# Patient Record
Sex: Male | Born: 1945 | Race: White | Marital: Single | Smoking: Current every day smoker
Health system: Southern US, Community
[De-identification: ages and names within clinical notes are randomized; demographics above are authoritative.]

## PROBLEM LIST (undated history)

## (undated) DIAGNOSIS — R296 Repeated falls: Secondary | ICD-10-CM

## (undated) DIAGNOSIS — N419 Inflammatory disease of prostate, unspecified: Secondary | ICD-10-CM

## (undated) HISTORY — DX: Inflammatory disease of prostate, unspecified: N41.9

## (undated) HISTORY — PX: TONSILLECTOMY: SUR1361

## (undated) HISTORY — DX: Repeated falls: R29.6

## (undated) HISTORY — PX: CYST REMOVAL NECK: SHX6281

## (undated) HISTORY — PX: VASECTOMY: SHX75

---

## 2015-03-24 ENCOUNTER — Ambulatory Visit (INDEPENDENT_AMBULATORY_CARE_PROVIDER_SITE_OTHER): Payer: Medicaid Other | Admitting: Neurology

## 2015-03-24 ENCOUNTER — Encounter: Payer: Self-pay | Admitting: Neurology

## 2015-03-24 VITALS — BP 184/86 | HR 79 | Ht 68.0 in | Wt 178.0 lb

## 2015-03-24 DIAGNOSIS — R569 Unspecified convulsions: Secondary | ICD-10-CM

## 2015-03-24 DIAGNOSIS — R4020 Unspecified coma: Secondary | ICD-10-CM | POA: Insufficient documentation

## 2015-03-24 DIAGNOSIS — R296 Repeated falls: Secondary | ICD-10-CM | POA: Insufficient documentation

## 2015-03-24 DIAGNOSIS — W19XXXD Unspecified fall, subsequent encounter: Principal | ICD-10-CM

## 2015-03-24 DIAGNOSIS — IMO0001 Reserved for inherently not codable concepts without codable children: Secondary | ICD-10-CM

## 2015-03-24 NOTE — Progress Notes (Signed)
PATIENT: Blake Stevens DOB: 05-25-1945  Chief Complaint  Patient presents with  . Frequent Falling    He is here with his sister, Venita Sheffield, today.  He estimates having at least 12 or more falls over the last 6 months.  Says he has no warning signs, such as dizziness, but feels his legs just give out for no reason.  He has brought in a recent MRI brain scan today for review.     HISTORICAL  Blake Stevens is a 70 years old right-handed male, accompanied by his sister Venita Sheffield, seen in refer by his primary care physician Dr. Lynelle Smoke for evaluation of frequent falling  Since 2016, he had recurrent episode of falling without any warning signs, gradually getting worse in 2016, in early January 2017, he had fell twice, he denied loss of consciousness, sudden drop to the floor, no bowel and bladder incontinence,   He denied persistent bilateral upper and lower chunky paresthesia or weakness, he denies gait difficulty in between, since summer of 2016, he began to have urinary urgency, occasionally bowel and bladder incontinence.   He carries a diagnosis of seizure in the past, he had one incident that while visiting his family at the hospital around 2008, he suddenly dropped to the floor, he had no recollection of loss of consciousness, but the event was witnessed by health caregiver, he was diagnosed with seizure, was prescribed medications, but he never took the medicine, there was no recurrent episode until 2016, he reported that current episode is very similar to previous episodes.   I have personally reviewed MRI of the brain with and without contrast in November 2016 from [facility], no acute lesions, generalized atrophy, mild supratentorium small vessel disease,   REVIEW OF SYSTEMS: Full 14 system review of systems performed and notable only for snoring, slurred speech, snoring, running nose  ALLERGIES: Allergies  Allergen Reactions  . Aspirin Shortness Of Breath    HOME  MEDICATIONS: Current Outpatient Prescriptions  Medication Sig Dispense Refill  . Acetaminophen (TYLENOL PO) as needed.    . calcium carbonate (TUMS) 500 MG chewable tablet as needed for indigestion or heartburn.    . Chlorphen-Phenyleph-ASA (ALKA-SELTZER PLUS COLD PO) as needed.    . Multiple Vitamins-Minerals (MULTIVITAMIN ADULT PO) daily.    . tamsulosin (FLOMAX) 0.4 MG CAPS capsule Take 0.4 mg by mouth daily.  3   No current facility-administered medications for this visit.    PAST MEDICAL HISTORY: Past Medical History  Diagnosis Date  . Prostatitis   . Frequent falls     PAST SURGICAL HISTORY: Past Surgical History  Procedure Laterality Date  . Cyst removal neck    . Vasectomy    . Tonsillectomy      FAMILY HISTORY: Family History  Problem Relation Age of Onset  . Colon cancer Father   . COPD Father   . Hypertension Mother   . Hyperlipidemia Mother   . Heart disease Mother     SOCIAL HISTORY:  Social History   Social History  . Marital Status: Single    Spouse Name: N/A  . Number of Children: 2  . Years of Education: HS   Occupational History  . Retired    Social History Main Topics  . Smoking status: Current Every Day Smoker -- 0.50 packs/day    Types: Cigarettes  . Smokeless tobacco: Not on file  . Alcohol Use: No     Comment: No alcohol use in the last 9 months.  Marland Kitchen  Drug Use: No  . Sexual Activity: Not on file   Other Topics Concern  . Not on file   Social History Narrative   Lives with his sister.   Right-handed.   32 ounces of caffenated tea per day.     PHYSICAL EXAM   Filed Vitals:   03/24/15 1038  BP: 184/86  Pulse: 79  Height:  (1.727 m)  Weight: 178 lb (80.74 kg)    Not recorded      Body mass index is 27.07 kg/(m^2).  PHYSICAL EXAMNIATION:  Gen: NAD, conversant, well nourised, obese, well groomed                     Cardiovascular: Regular rate rhythm, no peripheral edema, warm, nontender. Eyes: Conjunctivae  clear without exudates or hemorrhage Neck: Supple, no carotid bruise. Pulmonary: Clear to auscultation bilaterally   NEUROLOGICAL EXAM:  MENTAL STATUS: Speech:    Speech is normal; fluent and spontaneous with normal comprehension.  Cognition:     Orientation to time, place and person     Normal recent and remote memory     Normal Attention span and concentration     Normal Language, naming, repeating,spontaneous speech     Fund of knowledge   CRANIAL NERVES: CN II: Visual fields are full to confrontation. Fundoscopic exam is normal with sharp discs and no vascular changes. Pupils are round equal and briskly reactive to light. CN III, IV, VI: extraocular movement are normal. No ptosis. CN V: Facial sensation is intact to pinprick in all 3 divisions bilaterally. Corneal responses are intact.  CN VII: Face is symmetric with normal eye closure and smile. CN VIII: Hearing is normal to rubbing fingers CN IX, X: Palate elevates symmetrically. Phonation is normal. CN XI: Head turning and shoulder shrug are intact CN XII: Tongue is midline with normal movements and no atrophy.  MOTOR: There is no pronator drift of out-stretched arms. Muscle bulk and tone are normal. Muscle strength is normal.  REFLEXES: Reflexes are 2+ and symmetric at the biceps, triceps, knees, and ankles. Plantar responses are flexor.  SENSORY: Intact to light touch, pinprick, position sense, and vibration sense are intact in fingers and toes.  COORDINATION: Rapid alternating movements and fine finger movements are intact. There is no dysmetria on finger-to-nose and heel-knee-shin.    GAIT/STANCE: Posture is normal, Mildly wide-based, steady, he is able to walk on tiptoe, heels, he has mild tandem walking difficulty.   Romberg signs: Negative  DIAGNOSTIC DATA (LABS, IMAGING, TESTING) - I reviewed patient records, labs, notes, testing and imaging myself where available.   ASSESSMENT AND PLAN  Blake Stevens is  a 70 y.o. male   Frequent falling  Unsure etiology, he does has brisk reflexes on examination, need to rule out cervical spondylitic myelopathy,  Potential differentiation diagnosis also includes seizure, especially with his reported history of seizure  Proceed with MRI of cervical  EEG  Document or event   Return to clinic in 1 month     Levert Feinstein, M.D. Ph.D.  [facility] [address] [place], [place] [address] Ph: [phone] Fax: [phone]  CC: Juliette Alcide, MD

## 2015-04-01 ENCOUNTER — Encounter: Payer: Self-pay | Admitting: Neurology

## 2015-04-16 ENCOUNTER — Other Ambulatory Visit: Payer: Medicaid Other

## 2015-04-22 ENCOUNTER — Ambulatory Visit (INDEPENDENT_AMBULATORY_CARE_PROVIDER_SITE_OTHER): Payer: Medicaid Other | Admitting: Neurology

## 2015-04-22 DIAGNOSIS — R569 Unspecified convulsions: Secondary | ICD-10-CM

## 2015-04-22 DIAGNOSIS — IMO0001 Reserved for inherently not codable concepts without codable children: Secondary | ICD-10-CM

## 2015-04-22 DIAGNOSIS — W19XXXD Unspecified fall, subsequent encounter: Principal | ICD-10-CM

## 2015-04-23 NOTE — Procedures (Signed)
   HISTORY: 70 year old male with frequent unexpected falls.  TECHNIQUE:  16 channel EEG was performed based on standard 10-16 international system. One channel was dedicated to EKG, which has demonstrates normal sinus rhythm of 84 beats per minutes.  Upon awakening, the posterior background activity was well-developed, in alpha range, 8 Hz, reactive to eye opening and closure.  There was no evidence of epileptiform discharge.  Photic stimulation was performed, which induced a symmetric photic driving.  Hyperventilation was performed, there was no abnormality elicit.  No sleep was achieved.  CONCLUSION: This is a  normal awake EEG.  There is no electrodiagnostic evidence of epileptiform discharge

## 2015-04-30 ENCOUNTER — Encounter: Payer: Self-pay | Admitting: Neurology

## 2015-04-30 ENCOUNTER — Ambulatory Visit (INDEPENDENT_AMBULATORY_CARE_PROVIDER_SITE_OTHER): Payer: Medicaid Other | Admitting: Neurology

## 2015-04-30 VITALS — BP 163/79 | HR 79 | Ht 68.0 in | Wt 178.0 lb

## 2015-04-30 DIAGNOSIS — W19XXXD Unspecified fall, subsequent encounter: Principal | ICD-10-CM

## 2015-04-30 DIAGNOSIS — IMO0001 Reserved for inherently not codable concepts without codable children: Secondary | ICD-10-CM

## 2015-04-30 DIAGNOSIS — R569 Unspecified convulsions: Secondary | ICD-10-CM

## 2015-04-30 NOTE — Progress Notes (Signed)
PATIENT: Blake Stevens DOB: 03-Dec-1945  Chief Complaint  Patient presents with  . Frequent Falls    He is here with his sister, Nelida Gores and his mother, Venita Sheffield. He would like to review his EEG.  There was some confusion when [facility] called him to schedule his MRI and it was never completed. I called today and he now has a pending appt on 05/01/15 .  He has continued to have frequent falls.  Denies any serious injuries.     HISTORICAL  Blake Stevens is a 69 years old right-handed male, accompanied by his sister Venita Sheffield, seen in refer by his primary care physician Dr. Lynelle Smoke for evaluation of frequent falling  Since 2016, he had recurrent episode of falling without any warning signs, gradually getting worse in 2016, in early January 2017, he had fell twice, he denied loss of consciousness, sudden drop to the floor, no bowel and bladder incontinence,   He denied persistent bilateral upper and lower extremity paresthesia or weakness, he denies gait difficulty in between, since summer of 2016, he began to have urinary urgency, occasionally bowel and bladder incontinence.   He carries a diagnosis of seizure in the past, he had one incident that while visiting his family at the hospital around 2008, he suddenly dropped to the floor, he had no recollection of loss of consciousness, but the event was witnessed by health caregiver, he was diagnosed with seizure, was prescribed medications, but he never took the medicine, there was no recurrent episode until 2016, he reported that current episode is very similar to previous episodes.   I have personally reviewed MRI of the brain with and without contrast in November 2016 from [facility], no acute lesions, generalized atrophy, mild supratentorium small vessel disease,   Update April 30 2015: EEG was normal in Feb 2017. He had 5 episodes of sudden drop to the floor, it usually happened after he got up from a seated position for a  while, no LOC, no warning signs. MRI cervical is pending.  REVIEW OF SYSTEMS: Full 14 system review of systems performed and notable only for snoring, slurred speech, snoring, running nose  ALLERGIES: Allergies  Allergen Reactions  . Aspirin Shortness Of Breath    HOME MEDICATIONS: Current Outpatient Prescriptions  Medication Sig Dispense Refill  . Acetaminophen (TYLENOL PO) as needed.    . calcium carbonate (TUMS) 500 MG chewable tablet as needed for indigestion or heartburn.    . Chlorphen-Phenyleph-ASA (ALKA-SELTZER PLUS COLD PO) as needed.    Marland Kitchen lisinopril (PRINIVIL,ZESTRIL) 10 MG tablet TAKE 1 TABLET EVERY DAY FOR BLOOD PRESSURE  1  . Multiple Vitamins-Minerals (MULTIVITAMIN ADULT PO) daily.    . tamsulosin (FLOMAX) 0.4 MG CAPS capsule Take 0.4 mg by mouth daily.  3   No current facility-administered medications for this visit.    PAST MEDICAL HISTORY: Past Medical History  Diagnosis Date  . Prostatitis   . Frequent falls     PAST SURGICAL HISTORY: Past Surgical History  Procedure Laterality Date  . Cyst removal neck    . Vasectomy    . Tonsillectomy      FAMILY HISTORY: Family History  Problem Relation Age of Onset  . Colon cancer Father   . COPD Father   . Hypertension Mother   . Hyperlipidemia Mother   . Heart disease Mother     SOCIAL HISTORY:  Social History   Social History  . Marital Status: Single    Spouse Name: N/A  .  Number of Children: 2  . Years of Education: HS   Occupational History  . Retired    Social History Main Topics  . Smoking status: Current Every Day Smoker -- 0.50 packs/day    Types: Cigarettes  . Smokeless tobacco: Not on file  . Alcohol Use: No     Comment: No alcohol use in the last 9 months.  . Drug Use: No  . Sexual Activity: Not on file   Other Topics Concern  . Not on file   Social History Narrative   Lives with his sister.   Right-handed.   32 ounces of caffenated tea per day.     PHYSICAL EXAM     Filed Vitals:   04/30/15 1148  BP: 163/79  Pulse: 79  Height: 5\' 8"  (1.727 m)  Weight: 178 lb (80.74 kg)   Blood pressure lying down 167/89, heart rate of 92, sitting, 174/83, heart rate of 92, standing 188/74 heart rate of 94  Body mass index is 27.07 kg/(m^2).  PHYSICAL EXAMNIATION:  Gen: NAD, conversant, well nourised, obese, well groomed                     Cardiovascular: Regular rate rhythm, no peripheral edema, warm, nontender. Eyes: Conjunctivae clear without exudates or hemorrhage Neck: Supple, no carotid bruise. Pulmonary: Clear to auscultation bilaterally   NEUROLOGICAL EXAM:  MENTAL STATUS: Speech:    Speech is normal; fluent and spontaneous with normal comprehension.  Cognition:     Orientation to time, place and person     Normal recent and remote memory     Normal Attention span and concentration     Normal Language, naming, repeating,spontaneous speech     Fund of knowledge   CRANIAL NERVES: CN II: Visual fields are full to confrontation. Fundoscopic exam is normal with sharp discs and no vascular changes. Pupils are round equal and briskly reactive to light. CN III, IV, VI: extraocular movement are normal. No ptosis. CN V: Facial sensation is intact to pinprick in all 3 divisions bilaterally. Corneal responses are intact.  CN VII: Face is symmetric with normal eye closure and smile. CN VIII: Hearing is normal to rubbing fingers CN IX, X: Palate elevates symmetrically. Phonation is normal. CN XI: Head turning and shoulder shrug are intact CN XII: Tongue is midline with normal movements and no atrophy.  MOTOR: There is no pronator drift of out-stretched arms. Muscle bulk and tone are normal. Muscle strength is normal.  REFLEXES: Reflexes are 2+ and symmetric at the biceps, triceps, knees, and ankles. Plantar responses are flexor.  SENSORY: Intact to light touch, pinprick, position sense, and vibration sense are intact in fingers and  toes.  COORDINATION: Rapid alternating movements and fine finger movements are intact. There is no dysmetria on finger-to-nose and heel-knee-shin.    GAIT/STANCE: Posture is normal, Mildly wide-based, steady, he is able to walk on tiptoe, heels, he has mild tandem walking difficulty.   Romberg signs: Negative  DIAGNOSTIC DATA (LABS, IMAGING, TESTING) - I reviewed patient records, labs, notes, testing and imaging myself where available.   ASSESSMENT AND PLAN  Sharyon CableHarold Nazaire is a 70 y.o. male   Frequent falling  Unsure etiology, possible orthostatic hypoperfusion, no evidence of orthostatic blood pressure changes.  ECHO, US carotid  Document all event.    Levert FeinsteinYijun Raenell Mensing, M.D. Ph.D.  [facility] [address], [address] [place], [place] [address] Ph: [phone]) [phone] Fax: [phone]  CC: Juliette AlcideSteven E Burdine, MD

## 2015-05-01 ENCOUNTER — Ambulatory Visit
Admission: RE | Admit: 2015-05-01 | Discharge: 2015-05-01 | Disposition: A | Discharge status: Home / Self Care | Payer: Medicaid Other | Source: Ambulatory Visit | Attending: Neurology | Admitting: Neurology

## 2015-05-01 DIAGNOSIS — W19XXXD Unspecified fall, subsequent encounter: Secondary | ICD-10-CM | POA: Diagnosis not present

## 2015-05-01 DIAGNOSIS — R569 Unspecified convulsions: Secondary | ICD-10-CM | POA: Diagnosis not present

## 2015-05-01 DIAGNOSIS — IMO0001 Reserved for inherently not codable concepts without codable children: Secondary | ICD-10-CM

## 2015-05-04 ENCOUNTER — Telehealth: Payer: Self-pay | Admitting: Neurology

## 2015-05-04 NOTE — Telephone Encounter (Signed)
Spoke to his sister (on [employer]) - she is aware of results and will keep pending appts.

## 2015-05-04 NOTE — Telephone Encounter (Signed)
Please call patient, MRI of cervical spine showed multilevel degenerative changes, but there was no significant canal stenosis, no evidence of spinal cord damage, the findings would not explain his complaints of sudden falling, continue evaluation as planned during last office visit   IMPRESSION: This MRI of the cervical spine shows the following: 1. At C3-C4 there is moderately severe right foraminal narrowing at could lead to compression of the right C4 nerve root. 2. At C4-C5, the interspace appears to be fused of a degenerative nature. There does not appear to be any nerve root compression. 3. At C5-C6, there is borderline spinal stenosis due to degenerative changes. There is also moderately severe right greater than left foraminal narrowing and there is potential for right C6 nerve root compression. 4. At C6-C7, there is borderline spinal stenosis due to degenerative changes. There is moderately severe left foraminal narrowing that could lead to compression of the left C7 nerve root. 5. The spinal cord appears normal.  6. There are multiple subcutaneous solid structures with heterogenous appearance as detailed above. The 3 suboccipital lesions were seen on MRI of the brain 12/11/2014 and showed enhancement. These could represent neurofibromas or other soft tissue. Consider biopsy or other evaluation if not already done so.

## 2015-05-12 NOTE — Addendum Note (Signed)
Addended by: Levert FeinsteinYAN, Elyn Krogh on: 05/12/2015 08:53 AM   Modules accepted: Orders

## 2015-05-18 ENCOUNTER — Telehealth: Payer: Self-pay | Admitting: Neurology

## 2015-05-18 NOTE — Telephone Encounter (Signed)
Pt's sister called sts he is hard of hearing and when cardiologist office called he didn't get all the information. Please call her with name and address. Thank you

## 2015-05-19 NOTE — Telephone Encounter (Signed)
Called and spoke to patient"s  sister relayed [place] [facility] [place] [facility] . Patient's sister understood details of appointment.

## 2015-05-27 ENCOUNTER — Ambulatory Visit (INDEPENDENT_AMBULATORY_CARE_PROVIDER_SITE_OTHER): Payer: Medicaid Other

## 2015-05-27 ENCOUNTER — Encounter (INDEPENDENT_AMBULATORY_CARE_PROVIDER_SITE_OTHER): Payer: Self-pay

## 2015-05-27 ENCOUNTER — Other Ambulatory Visit: Payer: Self-pay

## 2015-05-27 ENCOUNTER — Ambulatory Visit: Payer: Medicaid Other

## 2015-05-27 ENCOUNTER — Telehealth: Payer: Self-pay | Admitting: Neurology

## 2015-05-27 DIAGNOSIS — R569 Unspecified convulsions: Secondary | ICD-10-CM

## 2015-05-27 DIAGNOSIS — W19XXXD Unspecified fall, subsequent encounter: Secondary | ICD-10-CM

## 2015-05-27 DIAGNOSIS — IMO0001 Reserved for inherently not codable concepts without codable children: Secondary | ICD-10-CM

## 2015-05-27 NOTE — Telephone Encounter (Signed)
Please call patient, echocardiogram showed no significant abnormality. 

## 2015-05-27 NOTE — Telephone Encounter (Signed)
Spoke to LorraineGladys (sister on [employer]) - she is aware of results.

## 2015-05-28 ENCOUNTER — Ambulatory Visit (INDEPENDENT_AMBULATORY_CARE_PROVIDER_SITE_OTHER): Payer: Medicaid Other | Admitting: Neurology

## 2015-05-28 ENCOUNTER — Telehealth: Payer: Self-pay | Admitting: Neurology

## 2015-05-28 ENCOUNTER — Encounter: Payer: Self-pay | Admitting: Neurology

## 2015-05-28 VITALS — BP 168/80 | HR 78 | Ht 68.0 in | Wt 175.0 lb

## 2015-05-28 DIAGNOSIS — M47812 Spondylosis without myelopathy or radiculopathy, cervical region: Secondary | ICD-10-CM | POA: Diagnosis not present

## 2015-05-28 DIAGNOSIS — I6529 Occlusion and stenosis of unspecified carotid artery: Secondary | ICD-10-CM

## 2015-05-28 NOTE — Telephone Encounter (Signed)
Please call patient, ultrasound of carotid artery showed widely patent left internal carotid artery, right internal carotid artery showed signal abnormality mild stenosis, there was also suggestion of occlusion, but it was a technically difficult study, could not visualize left vertebral artery, right vertebral artery was patent  I have ordered CT angiogram of the neck, please make sure he has normal creatinine level, if needed, he may come in for BUN/creatinine first

## 2015-05-28 NOTE — Telephone Encounter (Signed)
Attempted to reach by phone - rang multiple time with no answer or machine.  I will attempt the call again.

## 2015-05-28 NOTE — Progress Notes (Signed)
Chief Complaint  Patient presents with  . Frequent Falls    He is here with his sister, Cookie, to discuss his ECHO and carotid results.  He has fallen four additional times since his last visit on 04/30/15.       PATIENT: Blake Stevens DOB: 11/21/1945  Chief Complaint  Patient presents with  . Frequent Falls    He is here with his sister, Cookie, to discuss his ECHO and carotid results.  He has fallen four additional times since his last visit on 04/30/15.     HISTORICAL  Blake CableHarold Darrah is a 70 years old right-handed male, accompanied by his sister Venita SheffieldGladys, seen in refer by his primary care physician Dr. Lynelle SmokeStephen Burdine for evaluation of frequent falling  Since 2016, he had recurrent episode of falling without any warning signs, gradually getting worse in 2016, in early January 2017, he had fell twice, he denied loss of consciousness, sudden drop to the floor, no bowel and bladder incontinence,   He denied persistent bilateral upper and lower extremity paresthesia or weakness, he denies gait difficulty in between, since summer of 2016, he began to have urinary urgency, occasionally bowel and bladder incontinence.   He carries a diagnosis of seizure in the past, he had one incident that while visiting his family at the hospital around 2008, he suddenly dropped to the floor, he had no recollection of loss of consciousness, but the event was witnessed by health caregiver, he was diagnosed with seizure, was prescribed medications, but he never took the medicine, there was no recurrent episode until 2016, he reported that current episode is very similar to previous episodes.   I have personally reviewed MRI of the brain with and without contrast in November 2016 from [facility], no acute lesions, generalized atrophy, mild supratentorium small vessel disease,   Update April 30 2015: EEG was normal in Feb 2017. He had 5 episodes of sudden drop to the floor, it usually happened after he got up  from a seated position for a while, no LOC, no warning signs.  UPDATE April 6th 2017: He is accompanied by his sister and friend at today's clinical visit, since last visit, in one month span, he had a 4 falling episodes,  There was one episode witnessed by his sister, he was stepping out of his mother's house, holding a chocolate pie, he suddenly becomes staggered, he was able to try to catch his balance, and fell forward, no loss of consciousness, he was able to hold tight to the chocolate pie.  There also few other falling episode, it happened while stepping backwards, or extend his neck looking upwards. None of the episode is associated with loss of consciousness, no seizure-like activity described,   Echocardiogram in 2017 was reported normal, ejection fraction 55-60%,  We have personally reviewed MRI of cervical spine in March 2017: Multilevel degenerative changes, C3-C4 there is moderately severe right foraminal narrowing at could lead to compression of the right C4 nerve root. C4-C5, the interspace appears to be fused of a degenerative nature. There does not appear to be any nerve root compression.  C5-C6, there is borderline spinal stenosis due to degenerative changes. There is also moderately severe right greater than left foraminal narrowing and there is potential for right C6 nerve root compression.  C6-C7, there is borderline spinal stenosis due to degenerative changes. There is moderately severe left foraminal narrowing that could lead to compression of the left C7 nerve root. The spinal cord appears normal.  He was recently found to have an enlarging rectal mass, underwent evaluation,    REVIEW OF SYSTEMS: Full 14 system review of systems performed and notable only for snoring, slurred speech, snoring, running nose  ALLERGIES: Allergies  Allergen Reactions  . Aspirin Shortness Of Breath    HOME MEDICATIONS: Current Outpatient Prescriptions  Medication Sig Dispense Refill    . Acetaminophen (TYLENOL PO) as needed.    . calcium carbonate (TUMS) 500 MG chewable tablet as needed for indigestion or heartburn.    . Chlorphen-Phenyleph-ASA (ALKA-SELTZER PLUS COLD PO) as needed.    Marland Kitchen lisinopril (PRINIVIL,ZESTRIL) 10 MG tablet TAKE 1 TABLET EVERY DAY FOR BLOOD PRESSURE  1  . Multiple Vitamins-Minerals (MULTIVITAMIN ADULT PO) daily.    . tamsulosin (FLOMAX) 0.4 MG CAPS capsule Take 0.4 mg by mouth daily.  3   No current facility-administered medications for this visit.    PAST MEDICAL HISTORY: Past Medical History  Diagnosis Date  . Prostatitis   . Frequent falls     PAST SURGICAL HISTORY: Past Surgical History  Procedure Laterality Date  . Cyst removal neck    . Vasectomy    . Tonsillectomy      FAMILY HISTORY: Family History  Problem Relation Age of Onset  . Colon cancer Father   . COPD Father   . Hypertension Mother   . Hyperlipidemia Mother   . Heart disease Mother     SOCIAL HISTORY:  Social History   Social History  . Marital Status: Single    Spouse Name: N/A  . Number of Children: 2  . Years of Education: HS   Occupational History  . Retired    Social History Main Topics  . Smoking status: Current Every Day Smoker -- 0.50 packs/day    Types: Cigarettes  . Smokeless tobacco: Not on file  . Alcohol Use: No     Comment: No alcohol use in the last 9 months.  . Drug Use: No  . Sexual Activity: Not on file   Other Topics Concern  . Not on file   Social History Narrative   Lives with his sister.   Right-handed.   32 ounces of caffenated tea per day.     PHYSICAL EXAM   Filed Vitals:   05/28/15 1217  BP: 168/80  Pulse: 78  Height:  (1.727 m)  Weight: 175 lb (79.379 kg)   Blood pressure lying down 167/89, heart rate of 92, sitting, 174/83, heart rate of 92, standing 188/74 heart rate of 94  Body mass index is 26.61 kg/(m^2).  PHYSICAL EXAMNIATION:  Gen: NAD, conversant, well nourised, obese, well groomed                      Cardiovascular: Regular rate rhythm, no peripheral edema, warm, nontender. Eyes: Conjunctivae clear without exudates or hemorrhage Neck: Supple, no carotid bruise. Pulmonary: Clear to auscultation bilaterally   NEUROLOGICAL EXAM:  MENTAL STATUS: Speech:    Speech is normal; fluent and spontaneous with normal comprehension.  Cognition:     Orientation to time, place and person     Normal recent and remote memory     Normal Attention span and concentration     Normal Language, naming, repeating,spontaneous speech     Fund of knowledge   CRANIAL NERVES: CN II: Visual fields are full to confrontation. Fundoscopic exam is normal with sharp discs and no vascular changes. Pupils are round equal and briskly reactive to light. CN III, IV, VI:  extraocular movement are normal. No ptosis. CN V: Facial sensation is intact to pinprick in all 3 divisions bilaterally. Corneal responses are intact.  CN VII: Face is symmetric with normal eye closure and smile. CN VIII: Hearing is normal to rubbing fingers CN IX, X: Palate elevates symmetrically. Phonation is normal. CN XI: Head turning and shoulder shrug are intact CN XII: Tongue is midline with normal movements and no atrophy.  MOTOR: There is no pronator drift of out-stretched arms. Muscle bulk and tone are normal. Muscle strength is normal.  REFLEXES: Reflexes are 2+ and symmetric at the biceps, triceps, knees, and ankles. Plantar responses are flexor.  SENSORY: Intact to light touch, pinprick, position sense, and vibration sense are intact in fingers and toes.  COORDINATION: Rapid alternating movements and fine finger movements are intact. There is no dysmetria on finger-to-nose and heel-knee-shin.    GAIT/STANCE: Posture is normal, Mildly wide-based, steady, he is able to walk on tiptoe, heels, he has mild tandem walking difficulty.   Romberg signs: Negative  DIAGNOSTIC DATA (LABS, IMAGING, TESTING) - I reviewed patient  records, labs, notes, testing and imaging myself where available.   ASSESSMENT AND PLAN  Zadiel Leyh is a 70 y.o. male   Frequent falling  Unsure etiology, there was reported retro-instability, possible orthostatic hypoperfusion, versus hyperextension induced by dynamic cervical myelopathy,   complete evaluation with EMG nerve conduction study  MRI thoracic, lumbar   Levert Feinstein, M.D. Ph.D.  [facility] [address] [place], [place] [address] Ph: [phone] Fax: [phone]  CC: Juliette Alcide, MD

## 2015-06-01 ENCOUNTER — Telehealth: Payer: Self-pay | Admitting: *Deleted

## 2015-06-01 NOTE — Telephone Encounter (Signed)
Spoke to Cendant CorporationCookie (sister on [employer]) - they are agreeable to the CT angiogram of neck.  I will contact his urologist, Dr. Nechama GuardBauer in [place], to see if they have recent labs for him.

## 2015-06-01 NOTE — Telephone Encounter (Signed)
Disregard - encounter opened in error

## 2015-06-01 NOTE — Telephone Encounter (Signed)
Left message for Dr. Garner NashBauer's nurse [phone]) to return my call - need most recent BUN/Creatinine labs.

## 2015-06-02 NOTE — Telephone Encounter (Signed)
Received fax back from [place] from Dr Nechama GuardBauer office. She sent last OV note but noted there are no BUN/creatinine available.

## 2015-06-02 NOTE — Telephone Encounter (Signed)
Called Dr Nechama GuardBauer office again. Spoke to [place], Dr Garner NashBauer's nurse. She is going to fax most recent BUN/creatinine labs to [phone].

## 2015-06-03 ENCOUNTER — Other Ambulatory Visit: Payer: Self-pay | Admitting: *Deleted

## 2015-06-03 DIAGNOSIS — N289 Disorder of kidney and ureter, unspecified: Secondary | ICD-10-CM

## 2015-06-03 NOTE — Telephone Encounter (Signed)
Spoke to [place] Napier (friend on [employer]) - she will speak with the patient and his sister to let them know he needs the ordered labs below, prior to having his CT angiogram (provided the lab hours to her).

## 2015-06-03 NOTE — Telephone Encounter (Signed)
Spoke to [place] - they have not checked a recent BUN/Creatinine on patient.  Per Dr. Zannie CoveYan's previous orders, he will come here for labs prior to his CT angiogram.  Attempted to call his sister, Cookie, but was unable to reach her (no answering machine - will call back again).

## 2015-06-03 NOTE — Telephone Encounter (Signed)
Left message for Juliette AlcideMelinda - requesting recent BUN/Creatinine labs be faxed to my attention at [phone] (office notes received did not include his lab results).

## 2015-06-10 ENCOUNTER — Telehealth: Payer: Self-pay | Admitting: Neurology

## 2015-06-10 DIAGNOSIS — I6503 Occlusion and stenosis of bilateral vertebral arteries: Secondary | ICD-10-CM

## 2015-06-10 NOTE — Telephone Encounter (Signed)
Pt's sister Cookie called said she wanted to bring pt for labs on Friday, the same day as the MRI. Operator relayed that lab orders were in the system, she could bring him Friday before 11:30 as the office closes at 12:00.

## 2015-06-11 NOTE — Telephone Encounter (Signed)
Spoke to Cendant CorporationCookie - aware to [facility] - they have a lab in their office that will be drawing his BUN/Creatinine - labs will be ran STAT and resulted prior to his CT Angiogram.

## 2015-06-12 ENCOUNTER — Ambulatory Visit
Admission: RE | Admit: 2015-06-12 | Discharge: 2015-06-12 | Disposition: A | Discharge status: Home / Self Care | Payer: Medicaid Other | Source: Ambulatory Visit | Attending: Neurology | Admitting: Neurology

## 2015-06-12 DIAGNOSIS — I6529 Occlusion and stenosis of unspecified carotid artery: Secondary | ICD-10-CM

## 2015-06-12 IMAGING — CT CT ANGIO NECK
2 of 8 series · 7 of 33 positions shown · IV contrast ([ID] [id number])
Comparison: None.

CLINICAL DATA: Carotid artery stenosis abnormal ultrasound.
Frequent fall.

EXAM:
CT ANGIOGRAPHY NECK
TECHNIQUE: Multidetector CT imaging of the neck was performed using the
standard protocol during bolus administration of intravenous
contrast. Multiplanar CT image reconstructions and MIPs were
obtained to evaluate the vascular anatomy. Carotid stenosis
measurements (when applicable) are obtained utilizing NASCET
criteria, using the distal internal carotid diameter as the
denominator.
Creatinine was obtained on site at [HOSPITAL] at [REDACTED].
Results: Creatinine 1.2 mg/dL.
CONTRAST:  100 cc Isovue 370 intravenous

[Series 5: cta neck · axial · 0.45mm/px · z∈[-57,+33]mm · 2 of 110 slices shown]
[im 37/110  soft-tissue]
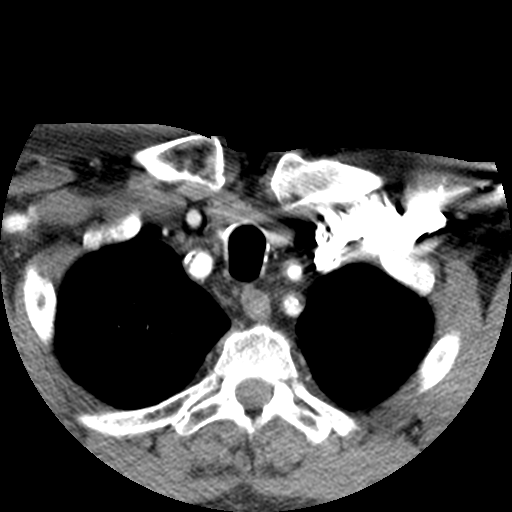
[im 73/110  soft-tissue]
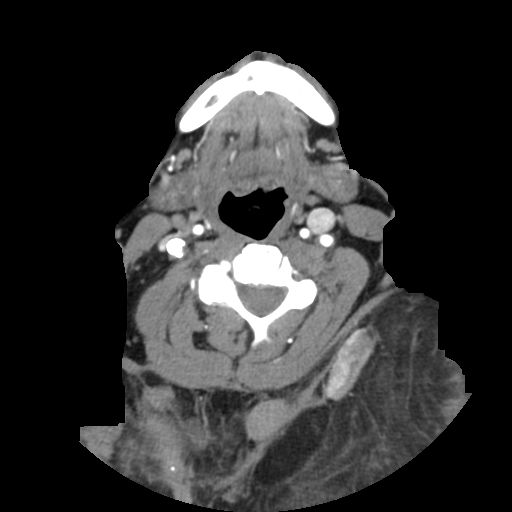

[Series 401: axial thin · axial · 0.54mm/px · z∈[-99,+83]mm · 5 of 274 slices shown]
[im 46/274  soft-tissue]
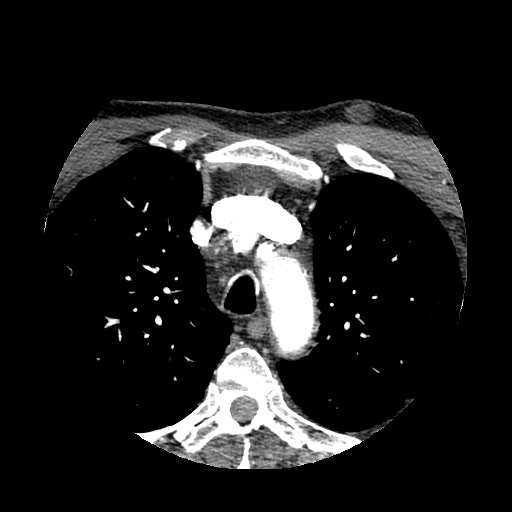
[im 92/274  bone]
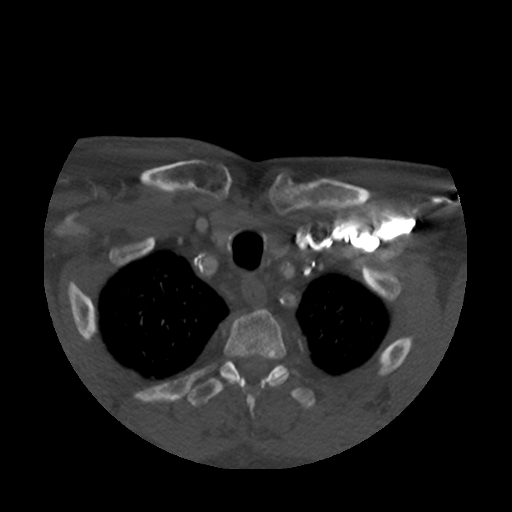
[im 137/274  soft-tissue]
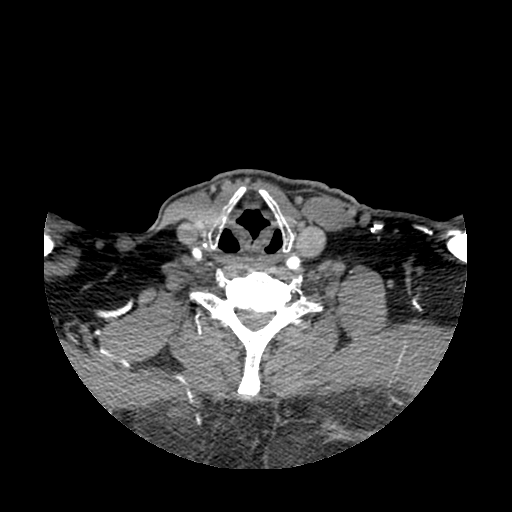
[im 183/274  bone]
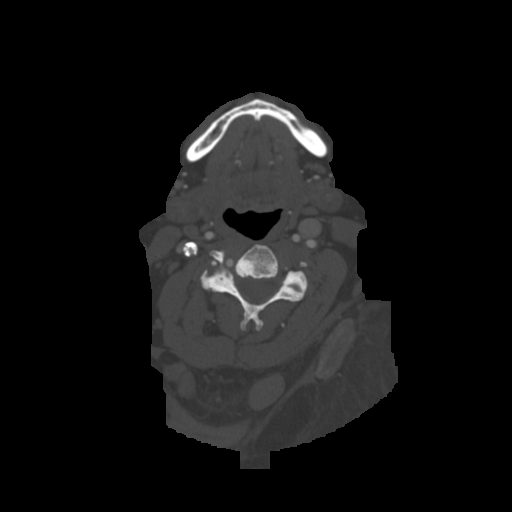
[im 228/274  soft-tissue]
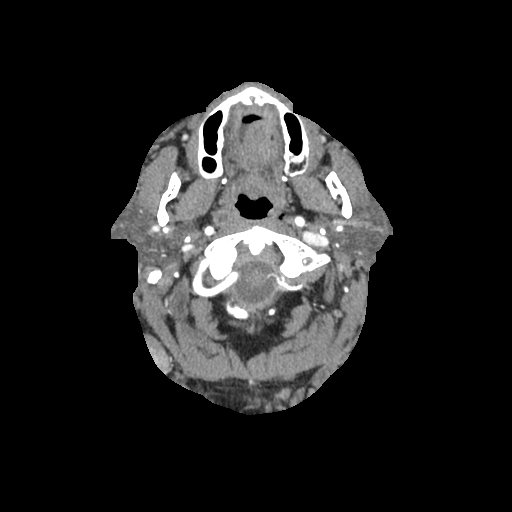

[7 of 33 positions shown; findings below may reference images not displayed]

FINDINGS: Aortic arch: Extensive atherosclerotic luminal irregularity. No
aneurysm or dissection. Three vessel branching

Right carotid system: Diffuse atheromatous thickening of the
brachiocephalic and common carotid arteries. Bulky plaque at in the
left proximal ICA with stenosis measuring 
50%. This stenosis is
16 mm beyond the bifurcation, below the angle of the mandible. No
dissection.

Left carotid system: Diffuse calcified and noncalcified plaque of
the common carotid. Mild borderline moderate origin stenosis. No ICA
stenosis. No dissection or plaque ulceration

Vertebral arteries:There is irregular plaque the proximal left
subclavian artery with web-like filling defect in the lumen. No flow
limiting stenosis of the subclavian arteries. High-grade stenosis of
the non dominant left vertebral artery origin. The left vertebral
artery is patent to the left PICA and basilar. The dominant right
vertebral artery shows advanced origin stenosis with intermittent
calcific plaque at the V3 segment. The vessel is patent to the
basilar.

Skeleton: No contributory finding.

Other neck: There are numerous masses throughout the subcutaneous
tissues, largest located in the upper subcutaneous back measuring
nearly 20 cm and containing fat, wispy soft tissue, and nodules.
Other lesions are more homogeneous and enhancing, including a 69 x
33 mm suboccipital mass. Upon review of [REDACTED], the patient
is being followed by [REDACTED]. Previous resection has noted
spindle cell like echogenic tumor.
IMPRESSION: 1. Advanced and extensive atherosclerosis.
2. 
50% proximal right ICA stenosis as described.
3. No significant left ICA stenosis.
4. High-grade bilateral vertebral artery origin stenosis, worse on
the non dominant left side.
5. Numerous subcutaneous masses, largest and predominately fatty in
the subcutaneous back. Per [REDACTED] these are followed by
[REDACTED] at [facility].

## 2015-06-12 MED ORDER — IOPAMIDOL (ISOVUE-370) INJECTION 76%
100.0000 mL | Freq: Once | INTRAVENOUS | Status: AC | PRN
  Administered 2015-06-12: 100 mL via INTRAVENOUS

## 2015-06-15 DIAGNOSIS — I6509 Occlusion and stenosis of unspecified vertebral artery: Secondary | ICD-10-CM | POA: Insufficient documentation

## 2015-06-15 MED ORDER — CLOPIDOGREL BISULFATE 75 MG PO TABS: 75.0000 mg | ORAL_TABLET | Freq: Every day | ORAL | Status: DC

## 2015-06-15 NOTE — Telephone Encounter (Signed)
Please call patient CT angiogram of the neck showed no significant multivessel stenosis, right internal carotid artery is about 50% stenosis, high-grade bilateral vertebral artery origin stenosis, which likely contributed to his complains of frequent fall  I will proceed with MRA of the brain and neck to further evaluate intracranial vasculature,  please advise patient to keep well hydration, He had a history of allergic reaction to aspirin, I have called in Plavix 75 mg daily    1. Advanced and extensive atherosclerosis. 2. ~50% proximal right ICA stenosis as described. 3. No significant left ICA stenosis. 4. High-grade bilateral vertebral artery origin stenosis, worse on the non dominant left side. 5. Numerous subcutaneous masses, largest and predominately fatty in the subcutaneous back. Per care everywhere these are [facility].

## 2015-06-15 NOTE — Telephone Encounter (Signed)
Attempted to reach patient and sister - no answer or machine - will attempt call again today.

## 2015-06-15 NOTE — Telephone Encounter (Signed)
Spoke to Cendant CorporationCookie (sister on [employer]) - she is aware of results.  She is going to pick up his Plavix today.  They are agreeable to the additional testing.  He will keep all pending appts for tests and follow up.

## 2015-07-07 ENCOUNTER — Encounter: Payer: Medicaid Other | Admitting: Neurology

## 2015-07-13 ENCOUNTER — Telehealth: Payer: Self-pay | Admitting: Neurology

## 2015-07-13 NOTE — Telephone Encounter (Signed)
Spoke to Cendant CorporationCookie - she is aware of results and he will keep his pending appts.

## 2015-07-13 NOTE — Telephone Encounter (Signed)
Please call patient for normal echocardiogram. 

## 2015-07-28 ENCOUNTER — Ambulatory Visit (INDEPENDENT_AMBULATORY_CARE_PROVIDER_SITE_OTHER): Payer: Medicaid Other | Admitting: Neurology

## 2015-07-28 DIAGNOSIS — I6503 Occlusion and stenosis of bilateral vertebral arteries: Secondary | ICD-10-CM | POA: Diagnosis not present

## 2015-07-28 DIAGNOSIS — M47812 Spondylosis without myelopathy or radiculopathy, cervical region: Secondary | ICD-10-CM | POA: Diagnosis not present

## 2015-07-28 NOTE — Progress Notes (Signed)
No chief complaint on file.      PATIENT: Blake Stevens DOB: 1945-12-29  No chief complaint on file.    HISTORICAL  Blake Stevens is a 70 years old right-handed male, accompanied by his sister Blake Stevens, seen in refer by his primary care physician Dr. Lynelle Smoke for evaluation of frequent falling  Since 2016, he had recurrent episode of falling without any warning signs, gradually getting worse in 2016, in early January 2017, he had fell twice, he denied loss of consciousness, sudden drop to the floor, no bowel and bladder incontinence,   He denied persistent bilateral upper and lower extremity paresthesia or weakness, he denies gait difficulty in between, since summer of 2016, he began to have urinary urgency, occasionally bowel and bladder incontinence.   He carries a diagnosis of seizure in the past, he had one incident that while visiting his family at the hospital around 2008, he suddenly dropped to the floor, he had no recollection of loss of consciousness, but the event was witnessed by health caregiver, he was diagnosed with seizure, was prescribed medications, but he never took the medicine, there was no recurrent episode until 2016, he reported that current episode is very similar to previous episodes.   I have personally reviewed MRI of the brain with and without contrast in November 2016 from [facility], no acute lesions, generalized atrophy, mild supratentorium small vessel disease,   Update April 30 2015: EEG was normal in Feb 2017. He had 5 episodes of sudden drop to the floor, it usually happened after he got up from a seated position for a while, no LOC, no warning signs.  UPDATE April 6th 2017: He is accompanied by his sister and friend at today's clinical visit, since last visit, in one month span, he had a 4 falling episodes,  There was one episode witnessed by his sister, he was stepping out of his mother's house, holding a chocolate pie, he suddenly becomes  staggered, he was able to try to catch his balance, and fell forward, no loss of consciousness, he was able to hold tight to the chocolate pie.  There also few other falling episode, it happened while stepping backwards, or extend his neck looking upwards. None of the episode is associated with loss of consciousness, no seizure-like activity described,   Echocardiogram in 2017 was reported normal, ejection fraction 55-60%,  We have personally reviewed MRI of cervical spine in March 2017: Multilevel degenerative changes, C3-C4 there is moderately severe right foraminal narrowing at could lead to compression of the right C4 nerve root. C4-C5, the interspace appears to be fused of a degenerative nature. There does not appear to be any nerve root compression.  C5-C6, there is borderline spinal stenosis due to degenerative changes. There is also moderately severe right greater than left foraminal narrowing and there is potential for right C6 nerve root compression.  C6-C7, there is borderline spinal stenosis due to degenerative changes. There is moderately severe left foraminal narrowing that could lead to compression of the left C7 nerve root. The spinal cord appears normal.   He was recently found to have an enlarging rectal mass, underwent evaluation,   UPDATE June 6th 2017: I have reviewed [facility] record by Dr. 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 50% proximal right ICA stenosis with calcification: No significant left ICA stenosis. High-grade bilateral vertebral artery origin stenosis, worse on the non dominant left side. Numerous subcutaneous masses, largest  and predominately fatty in the subcutaneous back.   Patient continue have progressive falling episode, on today's examination, he was also noted to have vertical gaze especially downward gaze palsy, moderate bilateral eye closure weakness, which is a progressive compared to previous visit few months ago.  REVIEW OF SYSTEMS: Full 14 system review of systems performed and notable only for as above  ALLERGIES: Allergies  Allergen Reactions  . Aspirin Shortness Of Breath    HOME MEDICATIONS: Current Outpatient Prescriptions  Medication Sig Dispense Refill  . Acetaminophen (TYLENOL PO) as needed.    . calcium carbonate (TUMS) 500 MG chewable tablet as needed for indigestion or heartburn.    . Chlorphen-Phenyleph-ASA (ALKA-SELTZER PLUS COLD PO) as needed.    . clopidogrel (PLAVIX) 75 MG tablet Take 1 tablet (75 mg total) by mouth daily. 30 tablet 11  . lisinopril (PRINIVIL,ZESTRIL) 10 MG tablet TAKE 1 TABLET EVERY DAY FOR BLOOD PRESSURE  1  . Multiple Vitamins-Minerals (MULTIVITAMIN ADULT PO) daily.    . tamsulosin (FLOMAX) 0.4 MG CAPS capsule Take 0.4 mg by mouth daily.  3   No current facility-administered medications for this visit.    PAST MEDICAL HISTORY: Past Medical History  Diagnosis Date  . Prostatitis   . Frequent falls     PAST SURGICAL HISTORY: Past Surgical History  Procedure Laterality Date  . Cyst removal neck    . Vasectomy    . Tonsillectomy      FAMILY HISTORY: Family History  Problem Relation Age of Onset  . Colon cancer Father   . COPD Father   . Hypertension Mother   . Hyperlipidemia Mother   . Heart disease Mother     SOCIAL HISTORY:  Social History   Social History  . Marital Status: Single    Spouse Name: N/A  . Number of Children: 2  . Years of Education: HS   Occupational History  . Retired    Social History Main Topics  . Smoking status: Current Every Day Smoker -- 0.50 packs/day    Types: Cigarettes  . Smokeless tobacco: Not on  file  . Alcohol Use: No     Comment: No alcohol use in the last 9 months.  . Drug Use: No  . Sexual Activity: Not on file   Other Topics Concern  . Not on file   Social History Narrative   Lives with his sister.   Right-handed.   32 ounces of caffenated tea per day.     PHYSICAL EXAM   There were no vitals filed for this visit. Blood pressure lying down 167/89, heart rate of 92, sitting, 174/83, heart rate of 92, standing 188/74 heart rate of 94  There is no weight on file to calculate BMI.  PHYSICAL EXAMNIATION:  Gen: NAD, conversant, well nourised, obese, well groomed                     Cardiovascular: Regular rate rhythm, no peripheral edema, warm, nontender. Eyes: Conjunctivae clear without exudates or hemorrhage Neck: Supple, no carotid bruise. Pulmonary: Clear to auscultation bilaterally   NEUROLOGICAL EXAM:  MENTAL STATUS: Speech:    Speech is normal; fluent and spontaneous with normal comprehension.  Cognition:     Orientation  to time, place and person     Normal recent and remote memory     Normal Attention span and concentration     Normal Language, naming, repeating,spontaneous speech     Fund of knowledge   CRANIAL NERVES: CN II: Visual fields are full to confrontation. Fundoscopic exam is normal with sharp discs and no vascular changes. Pupils are round equal and briskly reactive to light. CN III, IV, VI: He has vertical gaze palsy, especially when looking down CN V: normal pin prick in all 3 divisions bilaterally. Corneal responses are intact.  CN VII: Face is symmetric with normal, he has moderate eye closure weakness. CN VIII: Hearing is normal to rubbing fingers CN IX, X: Palate elevates symmetrically. Phonation is normal. CN XI: Head turning and shoulder shrug are intact CN XII: Tongue is midline with normal movements and no atrophy.  MOTOR: There is no pronator drift of out-stretched arms. Muscle bulk and tone are normal. Muscle strength is  normal.  REFLEXES: Reflexes are 2+ and symmetric at the biceps, triceps, knees, and ankles. Plantar responses are flexor.  SENSORY: Intact to light touch, pinprick, position sense, and vibration sense are intact in fingers and toes.  COORDINATION: Rapid alternating movements and fine finger movements are intact. There is no dysmetria on finger-to-nose and heel-knee-shin.    GAIT/STANCE: Posture is normal, Mildly wide-based, steady, tendency to fall backward.  DIAGNOSTIC DATA (LABS, IMAGING, TESTING) - I reviewed patient records, labs, notes, testing and imaging myself where available.   ASSESSMENT AND PLAN  Blake Stevens is a 70 y.o. male   Frequent falling  Unsure etiology, there was reported retro-instability, also noticed subacute onset of vertical gaze palsy, moderate bilateral eye closure weakness  Differentiation diagnosis includes central nervous system degenerative disorder, progressive supranuclear palsy, paraneoplastic syndrome   laboratory evaluations  Complete evaluation as previously planned, MRI of lumbar and thoracic spine  Bilateral vertebral artery stenosis  MRA of the brain and neck  Keep daily aspirin     Levert Feinstein, M.D. Ph.D.  [facility] [address], [address] [place], [place] [address] Ph: [phone] Fax: [phone]  CC: Juliette Alcide, MD

## 2015-07-28 NOTE — Procedures (Signed)
   NCS (NERVE CONDUCTION STUDY) WITH EMG (ELECTROMYOGRAPHY) REPORT   STUDY DATE: July 28 2015 PATIENT NAME: Blake Stevens DOB: 09-23-1945 MRN: [id number]    TECHNOLOGIST: Gearldine ShownLorraine Amezcua ELECTROMYOGRAPHER: Levert FeinsteinYan, Parthena Fergeson M.D.  CLINICAL INFORMATION:  70 year old male with a year history of progressive frequent falling episode, tendency to fall backwards, subacute onset of moderate eye-closure weakness, vertical gaze palsy.  FINDINGS: NERVE CONDUCTION STUDY: Bilateral peroneal sensory responses were normal.  Bilateral peroneal to EDB and tibial motor responses were normal. Bilateral tibial H reflexes were normal and symmetric.  NEEDLE ELECTROMYOGRAPHY: Selective needle examinations were performed at right upper, and lower extremity muscles, right cervical, lumbar paraspinal muscles.  Needle examination of right first dorsal interossei, pronator teres, extensor digitorum communis, biceps, triceps, deltoid was normal.  There is no spontaneous activity at right cervical paraspinal muscles, right C5-6 and 7.  Needle examination of right tibialis anterior, tibialis posterior, peroneal longus, medial gastrocnemius, vastus lateralis was normal.  There was no spontaneous activity at right lumbosacral paraspinal muscles, right L4-5 S1.  IMPRESSION:   This is a normal study. There is no electrodiagnostic evidence of large fiber peripheral neuropathy, there is no evidence of right cervical or lumbar sacral radiculopathy.   INTERPRETING PHYSICIAN:   Levert FeinsteinYan, Emmalea Treanor M.D. Ph.D. [facility] [address], [address] [place], [place] [address] [phone]

## 2015-07-30 ENCOUNTER — Other Ambulatory Visit: Payer: Self-pay | Admitting: *Deleted

## 2015-07-30 MED ORDER — CLOPIDOGREL BISULFATE 75 MG PO TABS: 75.0000 mg | ORAL_TABLET | Freq: Every day | ORAL | Status: AC

## 2015-07-31 ENCOUNTER — Encounter: Payer: Self-pay | Admitting: *Deleted

## 2015-07-31 ENCOUNTER — Telehealth: Payer: Self-pay | Admitting: Neurology

## 2015-07-31 NOTE — Telephone Encounter (Signed)
Aware of results and will start the recommend supplement.

## 2015-07-31 NOTE — Telephone Encounter (Signed)
Please call patient, extensive laboratory evaluations showed slightly decreased vitamin D level, rest of the laboratory evaluations were normal. He should take vitamin D3 over-the-counter 1000 units daily.

## 2015-08-04 ENCOUNTER — Ambulatory Visit
Admission: RE | Admit: 2015-08-04 | Discharge: 2015-08-04 | Disposition: A | Discharge status: Home / Self Care | Payer: Medicaid Other | Source: Ambulatory Visit | Attending: Neurology | Admitting: Neurology

## 2015-08-04 ENCOUNTER — Other Ambulatory Visit: Payer: Self-pay | Admitting: Neurology

## 2015-08-04 DIAGNOSIS — I6503 Occlusion and stenosis of bilateral vertebral arteries: Secondary | ICD-10-CM

## 2015-08-04 MED ORDER — GADOBENATE DIMEGLUMINE 529 MG/ML IV SOLN
16.0000 mL | Freq: Once | INTRAVENOUS | Status: AC | PRN
  Administered 2015-08-04: 16 mL via INTRAVENOUS

## 2015-08-06 ENCOUNTER — Ambulatory Visit
Admission: RE | Admit: 2015-08-06 | Discharge: 2015-08-06 | Disposition: A | Discharge status: Home / Self Care | Payer: Medicaid Other | Source: Ambulatory Visit | Attending: Neurology | Admitting: Neurology

## 2015-08-06 DIAGNOSIS — M47812 Spondylosis without myelopathy or radiculopathy, cervical region: Secondary | ICD-10-CM

## 2015-08-11 ENCOUNTER — Telehealth: Payer: Self-pay | Admitting: Neurology

## 2015-08-11 DIAGNOSIS — W19XXXD Unspecified fall, subsequent encounter: Principal | ICD-10-CM

## 2015-08-11 DIAGNOSIS — IMO0001 Reserved for inherently not codable concepts without codable children: Secondary | ICD-10-CM

## 2015-08-11 NOTE — Telephone Encounter (Signed)
Called again - no answer or machine available.

## 2015-08-11 NOTE — Telephone Encounter (Signed)
Attempted to reach patient - no answer or voicemail available - will try again.

## 2015-08-11 NOTE — Telephone Encounter (Signed)
Elon JesterMichele, please call patient, MRI of the thoracic show no significant abnormality, MRI of the lumbar showed degenerative disc disease, there was no significant nerve root compression  MRA of the brain and neck showed evidence of severe bilateral vertebral artery stenosis, but there was collateral forming, with patent basilar artery,  I do not think MRI findings explain his progressive difficulty, vertical gaze palsy, frequent falling episode, most likely central nervous system degenerative disorder, he should continue follow-up visit in September 01 2015, at the same time [facility] am [facility] for [facility].

## 2015-08-11 NOTE — Telephone Encounter (Signed)
Attempted call to patient again, without success. Unable to leave a message.

## 2015-08-12 NOTE — Telephone Encounter (Signed)
Spoke to Cendant CorporationCookie (pt's sister/POA on HIPPA) - she is aware of results and the patient will keep his follow up appointment to further discuss.

## 2015-08-15 LAB — COPPER, SERUM: COPPER: 105 ug/dL (ref 72–166)

## 2015-08-15 LAB — COMPREHENSIVE METABOLIC PANEL
ALBUMIN: 4.7 g/dL (ref 3.6–4.8)
ALT: 15 IU/L (ref 0–44)
AST: 14 IU/L (ref 0–40)
Albumin/Globulin Ratio: 1.9 (ref 1.2–2.2)
Alkaline Phosphatase: 76 IU/L (ref 39–117)
BUN/Creatinine Ratio: 20 (ref 10–24)
BUN: 20 mg/dL (ref 8–27)
CALCIUM: 10.2 mg/dL (ref 8.6–10.2)
CHLORIDE: 101 mmol/L (ref 96–106)
CO2: 21 mmol/L (ref 18–29)
CREATININE: 1 mg/dL (ref 0.76–1.27)
GFR, EST AFRICAN AMERICAN: 88 mL/min/{1.73_m2} (ref >59)
GFR, EST NON AFRICAN AMERICAN: 76 mL/min/{1.73_m2} (ref >59)
GLUCOSE: 102 mg/dL — AB (ref 65–99)
Globulin, Total: 2.5 g/dL (ref 1.5–4.5)
Potassium: 5.4 mmol/L — ABNORMAL HIGH (ref 3.5–5.2)
Sodium: 141 mmol/L (ref 134–144)
TOTAL PROTEIN: 7.2 g/dL (ref 6.0–8.5)

## 2015-08-15 LAB — MYASTHENIA GRAVIS FULL PANEL
ACETYLCHOL BLOCK AB: 23 % (ref 0–25)
AChR Binding Ab, Serum: <0.03 nmol/L (ref 0.00–0.24)
ANTI-STRIATION ABS: NEGATIVE

## 2015-08-15 LAB — ANA W/REFLEX IF POSITIVE: ANA: NEGATIVE

## 2015-08-15 LAB — RPR: RPR Ser Ql: NONREACTIVE

## 2015-08-15 LAB — CBC WITH DIFFERENTIAL/PLATELET
BASOS ABS: 0 10*3/uL (ref 0.0–0.2)
Basos: 1 %
EOS (ABSOLUTE): 0.2 10*3/uL (ref 0.0–0.4)
Eos: 2 %
HEMOGLOBIN: 14.4 g/dL (ref 12.6–17.7)
Hematocrit: 42.5 % (ref 37.5–51.0)
IMMATURE GRANULOCYTES: 0 %
Immature Grans (Abs): 0 10*3/uL (ref 0.0–0.1)
LYMPHS: 26 %
Lymphocytes Absolute: 2.3 10*3/uL (ref 0.7–3.1)
MCH: 28.3 pg (ref 26.6–33.0)
MCHC: 33.9 g/dL (ref 31.5–35.7)
MCV: 84 fL (ref 79–97)
MONOCYTES: 5 %
Monocytes Absolute: 0.5 10*3/uL (ref 0.1–0.9)
NEUTROS PCT: 66 %
Neutrophils Absolute: 5.9 10*3/uL (ref 1.4–7.0)
PLATELETS: 273 10*3/uL (ref 150–379)
RBC: 5.09 x10E6/uL (ref 4.14–5.80)
RDW: 15.2 % (ref 12.3–15.4)
WBC: 8.9 10*3/uL (ref 3.4–10.8)

## 2015-08-15 LAB — FOLATE: Folate: >20 ng/mL (ref >3.0)

## 2015-08-15 LAB — HEPATITIS PANEL, ACUTE
HEP A IGM: NEGATIVE
Hep B C IgM: NEGATIVE
Hepatitis B Surface Ag: NEGATIVE

## 2015-08-15 LAB — VITAMIN B12: VITAMIN B 12: 646 pg/mL (ref 211–946)

## 2015-08-15 LAB — THYROID PANEL WITH TSH
FREE THYROXINE INDEX: 1.7 (ref 1.2–4.9)
T3 Uptake Ratio: 24 % (ref 24–39)
T4 TOTAL: 7.2 ug/dL (ref 4.5–12.0)
TSH: 2 u[IU]/mL (ref 0.450–4.500)

## 2015-08-15 LAB — HIV ANTIBODY (ROUTINE TESTING W REFLEX): HIV Screen 4th Generation wRfx: NONREACTIVE

## 2015-08-15 LAB — C-REACTIVE PROTEIN: CRP: 3 mg/L (ref 0.0–4.9)

## 2015-08-15 LAB — VITAMIN D 25 HYDROXY (VIT D DEFICIENCY, FRACTURES): Vit D, 25-Hydroxy: 27.3 ng/mL — ABNORMAL LOW (ref 30.0–100.0)

## 2015-08-15 LAB — CK: Total CK: 126 U/L (ref 24–204)

## 2015-08-18 ENCOUNTER — Telehealth: Payer: Self-pay | Admitting: Neurology

## 2015-08-18 ENCOUNTER — Encounter: Payer: Self-pay | Admitting: *Deleted

## 2015-08-18 NOTE — Telephone Encounter (Signed)
Please call patient, extensive laboratory evaluation showed mildly decreased vitamin D 27, he should take over-the-counter D3 supplement 1000 units daily

## 2015-08-18 NOTE — Telephone Encounter (Signed)
Spoke to C[employer]Cookie (sister on HIPPA/POA) - she is aware of results and will have him start the recommended supplement.

## 2015-09-01 ENCOUNTER — Encounter: Payer: Self-pay | Admitting: Neurology

## 2015-09-01 ENCOUNTER — Ambulatory Visit (INDEPENDENT_AMBULATORY_CARE_PROVIDER_SITE_OTHER): Payer: Medicaid Other | Admitting: Neurology

## 2015-09-01 ENCOUNTER — Telehealth: Payer: Self-pay | Admitting: Neurology

## 2015-09-01 VITALS — BP 127/70 | HR 82 | Ht 68.0 in | Wt 188.0 lb

## 2015-09-01 DIAGNOSIS — R269 Unspecified abnormalities of gait and mobility: Secondary | ICD-10-CM | POA: Diagnosis not present

## 2015-09-01 DIAGNOSIS — G968 Other specified disorders of central nervous system: Secondary | ICD-10-CM | POA: Diagnosis not present

## 2015-09-01 DIAGNOSIS — G319 Degenerative disease of nervous system, unspecified: Secondary | ICD-10-CM

## 2015-09-01 MED ORDER — CARBIDOPA-LEVODOPA 25-100 MG PO TABS: 1.0000 | ORAL_TABLET | Freq: Three times a day (TID) | ORAL | Status: DC

## 2015-09-01 NOTE — Patient Instructions (Signed)
Anastasia FiedlerMustafa Saad Siddiqui, M.D. Associate Professor, Neurology  Clinical Interests Movement Disorders, Botulinum Toxin, Parkinson's Disease, Deep Brain Stimulation, Dystonia, Tremor Contact Information Request An Appointment New Patient Appointments: [phone]  Returning Patient Appointments: [phone] Department: [phone]

## 2015-09-01 NOTE — Progress Notes (Signed)
Chief Complaint  Patient presents with  . Fall    He is here with his sister, Blake Stevens.  He has continued to have falls.  They would like to review his MRIs, MRI, EEG and NCV/EMG.     Chief Complaint  Patient presents with  . Fall    He is here with his sister, Blake Stevens.  He has continued to have falls.  They would like to review his MRIs, MRI, EEG and NCV/EMG.         PATIENT: Blake Stevens DOB: 1945-09-18  Chief Complaint  Patient presents with  . Fall    He is here with his sister, Blake Stevens.  He has continued to have falls.  They would like to review his MRIs, MRI, EEG and NCV/EMG.       HISTORICAL  Blake Stevens is a 70 years old right-handed male, accompanied by his sister Blake Stevens, seen in refer by his primary care physician Dr. Florene Route for evaluation of frequent falling  Since 2016, he had recurrent episode of falling without any warning signs, gradually getting worse in 2016, in early January 2017, he had fell twice, he denied loss of consciousness, sudden drop to the floor, no bowel and bladder incontinence,   He denied persistent bilateral upper and lower extremity paresthesia or weakness, he denies gait difficulty in between, since summer of 2016, he began to have urinary urgency, occasionally bowel and bladder incontinence.   He carries a diagnosis of seizure in the past, he had one incident that while visiting his family at the hospital around 2008, he suddenly dropped to the floor, he had no recollection of loss of consciousness, but the event was witnessed by health caregiver, he was diagnosed with seizure, was prescribed medications, but he never took the medicine, there was no recurrent episode until 2016, he reported that current episode is very similar to previous episodes.   I have personally reviewed MRI of the brain with and without contrast in November 2016 from [facility], no acute lesions, generalized atrophy, mild supratentorium small vessel disease,    Update April 30 2015: EEG was normal in Feb 2017. He had 5 episodes of sudden drop to the floor, it usually happened after he got up from a seated position for a while, no LOC, no warning signs.  UPDATE April 6th 2017: He is accompanied by his sister and friend at today's clinical visit, since last visit, in one month span, he had a 4 falling episodes,  There was one episode witnessed by his sister, he was stepping out of his mother's house, holding a chocolate pie, he suddenly becomes staggered, he was able to try to catch his balance, and fell forward, no loss of consciousness, he was able to hold tight to the chocolate pie.  There also few other falling episode, it happened while stepping backwards, or extend his neck looking upwards. None of the episode is associated with loss of consciousness, no seizure-like activity described,   Echocardiogram in 2017 was reported normal, ejection fraction 55-60%,  We have personally reviewed MRI of cervical spine in March 2017: Multilevel degenerative changes, C3-C4 there is moderately severe right foraminal narrowing at could lead to compression of the right C4 nerve root. C4-C5, the interspace appears to be fused of a degenerative nature. There does not appear to be any nerve root compression.  C5-C6, there is borderline spinal stenosis due to degenerative changes. There is also moderately severe right greater than left foraminal narrowing and there is  potential for right C6 nerve root compression.  C6-C7, there is borderline spinal stenosis due to degenerative changes. There is moderately severe left foraminal narrowing that could lead to compression of the left C7 nerve root. The spinal cord appears normal.   He was recently found to have an enlarging rectal mass, underwent evaluation,   UPDATE June 6th 2017: I have reviewed [facility] record by Dr. Clovis Riley in January 2017, a history of numerous Lipoma, there was worrisome possible degenerated  liposarcoma, per record, biopsy was benign, or he is under close supervision,    I reviewed laboratory in January 2017, normal CBC, CMP with exception of mild elevated glucose 120  We also personally reviewed CT angiogram of neck in April 2017: Advanced and extensive atherosclerosis. 50% proximal right ICA stenosis with calcification: No significant left ICA stenosis. High-grade bilateral vertebral artery origin stenosis, worse on the non dominant left side. Numerous subcutaneous masses, largest and predominately fatty in the subcutaneous back.   Patient continue have progressive falling episode, on today's examination, he was also noted to have vertical gaze especially downward gaze palsy, moderate bilateral eye closure weakness, which is a progressive compared to previous visit few months ago.  UPDATE July 11th 2017: He is with his sister and friend at today's clinical visit, he continue have progressive worsening gait abnormality, tends to fall backwards, he denies REM sleep disorder, no memory loss, he also developed occasionally bowel and bladder incontinence, he denies anosmia,   I have personally reviewed MRI of brain in 2016, generalized atrophy, especially bilateral frontal, cerebellum region. MRI of cervical, lumbar spine mild degenerative disc disease no evidence of canal stenosis or significant foraminal stenosis, no significant abnormality on MRI of the thoracic spine  MRA of brain: The left vertebral artery is partially visualized and has a narrow, thready, irregular appearance near the vertebro-basilar junction. This may be due to proximal stenosis vs proximal occlusion and retrograde filling via the right vertebral artery. The left greater than right distal branches of posterior cerebral arteries have mild irregularities may be due to mild atherosclerosis.  We also reviewed extensive laboratory evaluations, normal or negative CMP, CBC, TSH, CPK, Lyme titer, ESR, C-reactive protein,  RPR, HIV, B12, copper level, only abnormality is mild decrease vitamin D 27.   REVIEW OF SYSTEMS: Full 14 system review of systems performed and notable only for as above  ALLERGIES: Allergies  Allergen Reactions  . Aspirin Shortness Of Breath    HOME MEDICATIONS: Current Outpatient Prescriptions  Medication Sig Dispense Refill  . Acetaminophen (TYLENOL PO) as needed.    . calcium carbonate (TUMS) 500 MG chewable tablet as needed for indigestion or heartburn.    . Chlorphen-Phenyleph-ASA (ALKA-SELTZER PLUS COLD PO) as needed.    . Cholecalciferol (VITAMIN D-3) 1000 units CAPS Take by mouth daily.    . clopidogrel (PLAVIX) 75 MG tablet Take 1 tablet (75 mg total) by mouth daily. 90 tablet 3  . lisinopril (PRINIVIL,ZESTRIL) 10 MG tablet TAKE 1 TABLET EVERY DAY FOR BLOOD PRESSURE  1  . Multiple Vitamins-Minerals (MULTIVITAMIN ADULT PO) daily.    . tamsulosin (FLOMAX) 0.4 MG CAPS capsule Take 0.4 mg by mouth daily.  3   No current facility-administered medications for this visit.    PAST MEDICAL HISTORY: Past Medical History  Diagnosis Date  . Prostatitis   . Frequent falls     PAST SURGICAL HISTORY: Past Surgical History  Procedure Laterality Date  . Cyst removal neck    . Vasectomy    .  Tonsillectomy      FAMILY HISTORY: Family History  Problem Relation Age of Onset  . Colon cancer Father   . COPD Father   . Hypertension Mother   . Hyperlipidemia Mother   . Heart disease Mother     SOCIAL HISTORY:  Social History   Social History  . Marital Status: Single    Spouse Name: N/A  . Number of Children: 2  . Years of Education: HS   Occupational History  . Retired    Social History Main Topics  . Smoking status: Current Every Day Smoker -- 0.50 packs/day    Types: Cigarettes  . Smokeless tobacco: Not on file  . Alcohol Use: No     Comment: No alcohol use in the last 9 months.  . Drug Use: No  . Sexual Activity: Not on file   Other Topics Concern  .  Not on file   Social History Narrative   Lives with his sister.   Right-handed.   32 ounces of caffenated tea per day.     PHYSICAL EXAM   Filed Vitals:   09/01/15 1637  BP: 127/70  Pulse: 82  Height: '5\' 8"'  (1.727 m)  Weight: 188 lb (85.276 kg)    Body mass index is 28.59 kg/(m^2).  PHYSICAL EXAMNIATION:  Gen: NAD, conversant, well nourised, obese, well groomed                     Cardiovascular: Regular rate rhythm, no peripheral edema, warm, nontender. Eyes: Conjunctivae clear without exudates or hemorrhage Neck: Supple, no carotid bruise. Pulmonary: Clear to auscultation bilaterally   NEUROLOGICAL EXAM:  MENTAL STATUS: Speech:    Speech is normal; fluent and spontaneous with normal comprehension.  Cognition:     Orientation to time, place and person     Normal recent and remote memory     Normal Attention span and concentration     Normal Language, naming, repeating,spontaneous speech     Fund of knowledge   CRANIAL NERVES: CN II: Visual fields are full to confrontation. Fundoscopic exam is normal with sharp discs and no vascular changes. Pupils are round equal and briskly reactive to light. CN III, IV, VI: He has vertical gaze palsy, especially when looking down CN V: normal pin prick in all 3 divisions bilaterally. Corneal responses are intact.  CN VII: Face is symmetric with normal, he has moderate eye closure weakness. CN VIII: Hearing is normal to rubbing fingers CN IX, X: Palate elevates symmetrically. Phonation is normal. CN XI: Head turning and shoulder shrug are intact CN XII: Tongue is midline with normal movements and no atrophy.  MOTOR: He has mild bilateral upper and lower extremity and nuchal rigidity, fairly symmetric, bilateral palmomental signs,  REFLEXES: Reflexes are 2+ and symmetric at the biceps, triceps, knees, and ankles. Plantar responses are flexor.  SENSORY: Intact to light touch, pinprick, position sense, and vibration sense are  intact in fingers and toes.  COORDINATION: Rapid alternating movements and fine finger movements are intact. There is no dysmetria on finger-to-nose and heel-knee-shin.    GAIT/STANCE:  wide-based, steady, significant retropulsed instability, still able to perform tandem walking, Romberg signs was negative   DIAGNOSTIC DATA (LABS, IMAGING, TESTING) - I reviewed patient records, labs, notes, testing and imaging myself where available.   ASSESSMENT AND PLAN  Grantham Hippert is a 70 y.o. 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 Deboraha Sprang movement specialist at [facility]  Will consider anti-GAD antibody, paraneoplastic antibody at next office visit  Vertebral artery stenosis  Keep daily aspirin     Blake Stevens, M.D. Ph.D.  [facility] [address] [place], [place] [address] Ph: [phone] Fax: [phone]  CC: Curlene Labrum, MD

## 2015-09-01 NOTE — Telephone Encounter (Signed)
Please check [facility]  Anastasia FiedlerMustafa Saad Siddiqui, M.D. Associate Professor, Neurology Neurosurgery Clinical Interests Movement Disorders, Botulinum Toxin, Parkinson's Disease, Deep Brain Stimulation, Dystonia, Tremor Contact Information Request An Appointment New Patient Appointments: [phone]  Returning Patient Appointments: [phone] Department: [phone]

## 2015-09-02 NOTE — Telephone Encounter (Addendum)
[facility] at [facility].  They have called the patient and mailed new patient paperwork.

## 2015-11-30 ENCOUNTER — Encounter: Payer: Self-pay | Admitting: Neurology

## 2015-11-30 ENCOUNTER — Ambulatory Visit (INDEPENDENT_AMBULATORY_CARE_PROVIDER_SITE_OTHER): Payer: Medicaid Other | Admitting: Neurology

## 2015-11-30 VITALS — BP 141/73 | HR 82 | Ht 68.0 in | Wt 163.0 lb

## 2015-11-30 DIAGNOSIS — R569 Unspecified convulsions: Secondary | ICD-10-CM

## 2015-11-30 DIAGNOSIS — R296 Repeated falls: Secondary | ICD-10-CM

## 2015-11-30 DIAGNOSIS — I6503 Occlusion and stenosis of bilateral vertebral arteries: Secondary | ICD-10-CM

## 2015-11-30 DIAGNOSIS — G319 Degenerative disease of nervous system, unspecified: Secondary | ICD-10-CM

## 2015-11-30 DIAGNOSIS — R634 Abnormal weight loss: Secondary | ICD-10-CM

## 2015-11-30 DIAGNOSIS — G968 Other specified disorders of central nervous system: Secondary | ICD-10-CM | POA: Diagnosis not present

## 2015-11-30 DIAGNOSIS — F17200 Nicotine dependence, unspecified, uncomplicated: Secondary | ICD-10-CM | POA: Insufficient documentation

## 2015-11-30 DIAGNOSIS — M47812 Spondylosis without myelopathy or radiculopathy, cervical region: Secondary | ICD-10-CM | POA: Diagnosis not present

## 2015-11-30 MED ORDER — CARBIDOPA-LEVODOPA 25-100 MG PO TABS: 1.0000 | ORAL_TABLET | Freq: Three times a day (TID) | ORAL | 11 refills | Status: DC

## 2015-11-30 NOTE — Progress Notes (Addendum)
Chief Complaint  Patient presents with  . Fall    He is here with sister, Blake Stevens and friend, Blake Stevens. He is still having falls but the the frequency has lessened.  He has a pending appt with Dr. Russ Halo at [facility] on 04/13/16 at [place].  He has noticed a decreased appetite and has lost 25 pounds over the last 3 months.   Chief Complaint  Patient presents with  . Fall    He is here with sister, Blake Stevens and friend, Blake Stevens. He is still having falls but the the frequency has lessened.  He has a pending appt with Dr. Russ Halo at [facility] on 04/13/16 at [place].  He has noticed a decreased appetite and has lost 25 pounds over the last 3 months.       PATIENT: Blake Stevens DOB: 05-24-1945  Chief Complaint  Patient presents with  . Fall    He is here with sister, Blake Stevens and friend, Blake Stevens. He is still having falls but the the frequency has lessened.  He has a pending appt with Dr. Russ Halo at [facility] on 04/13/16 at [place].  He has noticed a decreased appetite and has lost 25 pounds over the last 3 months.     HISTORICAL  Blake Stevens is a 70 years old right-handed male, accompanied by his sister Blake Stevens, seen in refer by his primary care physician Dr. Florene Route for evaluation of frequent falling  Since 2016, he had recurrent episode of falling without any warning signs, gradually getting worse in 2016, in early January 2017, he had fell twice, he denied loss of consciousness, sudden drop to the floor, no bowel and bladder incontinence,   He denied persistent bilateral upper and lower extremity paresthesia or weakness, he denies gait difficulty in between, since summer of 2016, he began to have urinary urgency, occasionally bowel and bladder incontinence.   He carries a diagnosis of seizure in the past, he had one incident that while visiting his family at the hospital around 2008, he suddenly dropped to the floor, he had no recollection of loss of consciousness, but the event was witnessed by health  caregiver, he was diagnosed with seizure, was prescribed medications, but he never took the medicine, there was no recurrent episode until 2016, he reported that current episode is very similar to previous episodes.   EEG was normal in Feb 2017. Echocardiogram in 2017 was reported normal, ejection fraction 55-60%,  Over the past 2 years, he was noted to have gradually worsening vertical eye movement, continue has frequent falling episode,  MRI of the brain with and without contrast in November 2016 from [facility], generalized atrophy, especially bilateral frontal, cerebellum, tectal region, mild supratentorium small vessel disease,   MRI of cervical spine in March 2017: Multilevel degenerative changes, Mild canal stenosis, most severe at C5-6, C6-7, no cord signal change, variable degree of foraminal stenosis, severe on right C3-4, C5-6, moderate left C6-7 foraminal stenosis,   MRI  lumbar spine mild degenerative disc disease no evidence of canal stenosis or significant foraminal stenosis, no significant abnormality on MRI of the thoracic spine   He was seen by [facility]  Dr. 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 50% proximal right ICA stenosis with calcification: No significant left ICA stenosis. High-grade bilateral vertebral artery origin stenosis, worse on the non  dominant left side. Numerous subcutaneous masses, largest and predominately fatty in the subcutaneous back.   MRA of brain: The left vertebral artery is partially visualized and has a narrow, thready, irregular appearance near the vertebro-basilar junction. This may be due to proximal stenosis vs proximal occlusion and retrograde filling via the right vertebral artery. The left greater than right distal branches of  posterior cerebral arteries have mild irregularities may be due to mild atherosclerosis.  Extensive laboratory evaluations, normal or negative CMP, CBC, TSH, CPK, Lyme titer,  myasthenia gravis panel ESR, C-reactive protein, RPR, HIV, B12, copper level, only abnormality is mild decrease vitamin D 27.  He was given a trial of Sinemet 25/100 mg 3 times a day since July 2017, he did not notice significant improvement, no significant side effect noticed, he lives with his sister, has become much less active, denies significant memory loss, but over the past few months, he has lost 30 pounds unintentionally, he used to be a heavy smoker, not only smoked 5 cigarettes each day,   REVIEW OF SYSTEMS: Full 14 system review of systems performed and notable only for, walking difficulty   ALLERGIES: Allergies  Allergen Reactions  . Aspirin Shortness Of Breath    HOME MEDICATIONS: Current Outpatient Prescriptions  Medication Sig Dispense Refill  . Acetaminophen (TYLENOL PO) as needed.    . calcium carbonate (TUMS) 500 MG chewable tablet as needed for indigestion or heartburn.    . carbidopa-levodopa (SINEMET IR) 25-100 MG tablet Take 1 tablet by mouth 3 (three) times daily. 90 tablet 6  . Chlorphen-Phenyleph-ASA (ALKA-SELTZER PLUS COLD PO) as needed.    . Cholecalciferol (VITAMIN D-3) 1000 units CAPS Take by mouth daily.    . clopidogrel (PLAVIX) 75 MG tablet Take 1 tablet (75 mg total) by mouth daily. 90 tablet 3  . lisinopril (PRINIVIL,ZESTRIL) 10 MG tablet TAKE 1 TABLET EVERY DAY FOR BLOOD PRESSURE  1  . Multiple Vitamins-Minerals (MULTIVITAMIN ADULT PO) daily.    . tamsulosin (FLOMAX) 0.4 MG CAPS capsule Take 0.4 mg by mouth daily.  3   No current facility-administered medications for this visit.     PAST MEDICAL HISTORY: Past Medical History:  Diagnosis Date  . Frequent falls   . Prostatitis     PAST SURGICAL HISTORY: Past Surgical History:  Procedure Laterality Date  . CYST REMOVAL  NECK    . TONSILLECTOMY    . VASECTOMY      FAMILY HISTORY: Family History  Problem Relation Age of Onset  . Colon cancer Father   . COPD Father   . Hypertension Mother   . Hyperlipidemia Mother   . Heart disease Mother     SOCIAL HISTORY:  Social History   Social History  . Marital status: Single    Spouse name: N/A  . Number of children: 2  . Years of education: HS   Occupational History  . Retired    Social History Main Topics  . Smoking status: Current Every Day Smoker    Packs/day: 0.50    Types: Cigarettes  . Smokeless tobacco: Not on file  . Alcohol use No     Comment: No alcohol use in the last 9 months.  . Drug use: No  . Sexual activity: Not on file   Other Topics Concern  . Not on file   Social History Narrative   Lives with his sister.   Right-handed.   32 ounces of caffenated tea per day.     PHYSICAL EXAM  Vitals:   11/30/15 1443  BP: (!) 141/73  Pulse: 82  Weight: 163 lb (73.9 kg)  Height: '5\' 8"'  (1.727 m)    Body mass index is 24.78 kg/m.  PHYSICAL EXAMNIATION:  Gen: NAD, conversant, well nourised, obese, well groomed                     Cardiovascular: Regular rate rhythm, no peripheral edema, warm, nontender. Eyes: Conjunctivae clear without exudates or hemorrhage Neck: Supple, no carotid bruise. Pulmonary: Clear to auscultation bilaterally   NEUROLOGICAL EXAM:  MENTAL STATUS: Speech:    Speech is normal; fluent and spontaneous with normal comprehension.  Cognition:     Orientation to time, place and person     Normal recent and remote memory     Normal Attention span and concentration     Normal Language, naming, repeating,spontaneous speech     Fund of knowledge   CRANIAL NERVES: CN II: Visual fields are full to confrontation. Fundoscopic exam is normal with sharp discs and no vascular changes. Pupils are round equal and briskly reactive to light. CN III, IV, VI: He has vertical gaze palsyin both upward and  downward gait, fairly normal horizontal eye movement CN V: normal pin prick in all 3 divisions bilaterally. Corneal responses are intact.  CN VII: Face is symmetric with normal, he has moderate eye closure and bilateral cheek puff weakness. mild neck flexion weakness  CN VIII: Hearing is normal to rubbing fingers CN IX, X: Palate elevates symmetrically. Phonation is normal. CN XI: Head turning and shoulder shrug are intact CN XII: Tongue is midline with normal movements and no atrophy.  MOTOR: He has mild bilateral upper and lower extremity and nuchal rigidity, fairly symmetric, bilateral palmomental signs,  REFLEXES: Reflexes are 2+ and symmetric at the biceps, triceps, knees, and ankles. Plantar responses are flexor.  SENSORY: Intact to light touch, pinprick, position sense, and vibration sense are intact in fingers and toes.  COORDINATION: Rapid alternating movements and fine finger movements are intact. There is no dysmetria on finger-to-nose and heel-knee-shin.    GAIT/STANCE:  wide-based, steady, significant retropulsed instability, still able to perform tandem walking, Romberg signs was negative   DIAGNOSTIC DATA (LABS, IMAGING, TESTING) - I reviewed patient records, labs, notes, testing and imaging myself where available.   ASSESSMENT AND PLAN  Khiry Pasquariello is a 70 y.o. 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 Deboraha Sprang movement specialist at [facility], will have appointment Apr 13 2016 at 8am  anti-GAD antibody, paraneoplastic antibody   Vertebral artery stenosis  Keep daily  aspirin    Rapid weight loss, long time smoker Ct chest wo  Marcial Pacas, M.D. Ph.D.  [facility] [address] [place], [place] [address] Ph: [phone] Fax: [phone]  CC: Curlene Labrum, MD

## 2015-12-02 LAB — PARANEOPLASTIC PROFILE II
Neuronal Nuc Ab (Ri), IFA: <1:10 {titer}
Neuronal Nuclear (Hu) Antibody (IB): <1:10 {titer}

## 2015-12-02 LAB — GLUTAMIC ACID DECARBOXYLASE AUTO ABS: Glutamic Acid Decarb Ab: <5 U/mL (ref 0.0–5.0)

## 2015-12-07 ENCOUNTER — Telehealth: Payer: Self-pay | Admitting: Neurology

## 2015-12-07 ENCOUNTER — Ambulatory Visit
Admission: RE | Admit: 2015-12-07 | Discharge: 2015-12-07 | Disposition: A | Discharge status: Home / Self Care | Payer: Medicaid Other | Source: Ambulatory Visit | Attending: Neurology | Admitting: Neurology

## 2015-12-07 DIAGNOSIS — R296 Repeated falls: Secondary | ICD-10-CM

## 2015-12-07 DIAGNOSIS — G968 Other specified disorders of central nervous system: Secondary | ICD-10-CM

## 2015-12-07 DIAGNOSIS — R569 Unspecified convulsions: Secondary | ICD-10-CM

## 2015-12-07 DIAGNOSIS — I6503 Occlusion and stenosis of bilateral vertebral arteries: Secondary | ICD-10-CM

## 2015-12-07 DIAGNOSIS — R634 Abnormal weight loss: Secondary | ICD-10-CM

## 2015-12-07 DIAGNOSIS — M47812 Spondylosis without myelopathy or radiculopathy, cervical region: Secondary | ICD-10-CM

## 2015-12-07 DIAGNOSIS — F17200 Nicotine dependence, unspecified, uncomplicated: Secondary | ICD-10-CM

## 2015-12-07 DIAGNOSIS — G319 Degenerative disease of nervous system, unspecified: Secondary | ICD-10-CM

## 2015-12-07 NOTE — Telephone Encounter (Signed)
Unable to reach Cookie (sister on HIPAA). Spoke to [place] (friend on HIPAA) - she is aware of results and will share them with both the patient and his sister.

## 2015-12-07 NOTE — Telephone Encounter (Signed)
Please call patient, CT chest showed no significant pulmonary disease, chronic nodules, no change.  IMPRESSION: 1. No suspicious pulmonary mass identified. Right upper lobe nodule and subpleural left lower lobe nodule are unchanged. 2. Unchanged appearance of numerous subcutaneous low-attenuation foci. 3. Coronary and aortic atherosclerosis.

## 2016-03-16 ENCOUNTER — Telehealth: Payer: Self-pay | Admitting: *Deleted

## 2016-03-16 NOTE — Telephone Encounter (Signed)
Pt MRI Cd mailed to Dr Cheri RousMustafa Siddiqi on 03/16/2016.

## 2016-05-31 ENCOUNTER — Ambulatory Visit (INDEPENDENT_AMBULATORY_CARE_PROVIDER_SITE_OTHER): Payer: Medicaid Other | Admitting: Neurology

## 2016-05-31 ENCOUNTER — Encounter: Payer: Self-pay | Admitting: Neurology

## 2016-05-31 ENCOUNTER — Encounter (INDEPENDENT_AMBULATORY_CARE_PROVIDER_SITE_OTHER): Payer: Self-pay

## 2016-05-31 VITALS — BP 163/83 | HR 82 | Ht 68.0 in | Wt 166.0 lb

## 2016-05-31 DIAGNOSIS — G231 Progressive supranuclear ophthalmoplegia [Steele-Richardson-Olszewski]: Secondary | ICD-10-CM | POA: Diagnosis not present

## 2016-05-31 DIAGNOSIS — R296 Repeated falls: Secondary | ICD-10-CM | POA: Diagnosis not present

## 2016-05-31 DIAGNOSIS — I6503 Occlusion and stenosis of bilateral vertebral arteries: Secondary | ICD-10-CM

## 2016-05-31 MED ORDER — CARBIDOPA-LEVODOPA 25-100 MG PO TABS: 2.0000 | ORAL_TABLET | Freq: Three times a day (TID) | ORAL | 11 refills | Status: DC

## 2016-05-31 NOTE — Progress Notes (Signed)
Chief Complaint  Patient presents with  . Degenerative Disease of CNS    He is here with his sister Lorenza Chick, his friend [name], and his mother [name].  He is has just completed PT and discharged to continue the exercises at home.  His walking has improved and he has only had one fall in the last 3-4 weeks.  He was seen by Dr. Linus Mako at [facility] on 04/13/16.   Chief Complaint  Patient presents with  . Degenerative Disease of CNS    He is here with his sister Lorenza Chick, his friend [name], and his mother [name].  He is has just completed PT and discharged to continue the exercises at home.  His walking has improved and he has only had one fall in the last 3-4 weeks.  He was seen by Dr. Linus Mako at [facility] on 04/13/16.       PATIENT: Blake Stevens DOB: 25-Apr-1945  Chief Complaint  Patient presents with  . Degenerative Disease of CNS    He is here with his sister Lorenza Chick, his friend [name], and his mother [name].  He is has just completed PT and discharged to continue the exercises at home.  His walking has improved and he has only had one fall in the last 3-4 weeks.  He was seen by Dr. Linus Mako at [facility] on 04/13/16.     HISTORICAL  Blake Stevens is a 72 years old right-handed male, accompanied by his sister [name], seen in refer by his primary care physician Dr. Florene Route for evaluation of frequent falling  Since 2016, he had recurrent episode of falling without any warning signs, gradually getting worse in 2016, in early January 2017, he had fell twice, he denied loss of consciousness, sudden drop to the floor, no bowel and bladder incontinence,   He denied persistent bilateral upper and lower extremity paresthesia or weakness, he denies gait difficulty in between, since summer of 2016, he began to have urinary urgency, occasionally bowel and bladder incontinence.   He carries a diagnosis of seizure in the past, he had one incident that while visiting his family at the hospital around  2008, he suddenly dropped to the floor, he had no recollection of loss of consciousness, but the event was witnessed by health caregiver, he was diagnosed with seizure, was prescribed medications, but he never took the medicine, there was no recurrent episode until 2016, he reported that current episode is very similar to previous episodes.   EEG was normal in Feb 2017. Echocardiogram in 2017 was reported normal, ejection fraction 55-60%,  Over the past 2 years, he was noted to have gradually worsening vertical eye movement, continue has frequent falling episode,  MRI of the brain with and without contrast in November 2016 from [facility], generalized atrophy, especially bilateral frontal, cerebellum, tectal region, mild supratentorium small vessel disease,   MRI of cervical spine in March 2017: Multilevel degenerative changes, Mild canal stenosis, most severe at C5-6, C6-7, no cord signal change, variable degree of foraminal stenosis, severe on right C3-4, C5-6, moderate left C6-7 foraminal stenosis,   MRI  lumbar spine mild degenerative disc disease no evidence of canal stenosis or significant foraminal stenosis, no significant abnormality on MRI of the thoracic spine   He was seen by [facility]  Dr. 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 50% proximal right ICA stenosis with calcification: No significant left ICA stenosis. High-grade bilateral vertebral artery origin stenosis, worse on the non dominant left side. Numerous subcutaneous masses, largest and predominately fatty in the subcutaneous back.   MRA of brain: The left vertebral artery is partially visualized and has a narrow, thready, irregular appearance near the vertebro-basilar junction. This may be due to proximal stenosis vs  proximal occlusion and retrograde filling via the right vertebral artery. The left greater than right distal branches of posterior cerebral arteries have mild irregularities may be due to mild atherosclerosis.  Extensive laboratory evaluations, normal or negative CMP, CBC, TSH, CPK, Lyme titer,  myasthenia gravis panel ESR, C-reactive protein, RPR, HIV, B12, copper level, only abnormality is mild decrease vitamin D 27.  He was given a trial of Sinemet 25/100 mg 3 times a day since July 2017, he did not notice significant improvement, no significant side effect noticed, he lives with his sister, has become much less active, denies significant memory loss, but over the past few months, he has lost 30 pounds unintentionally, he used to be a heavy smoker, not only smoked 5 cigarettes each day,  UPDATE May 31 2016:  He was seen by Dr. Hoyt Koch on Apr 13 2016,  He was noted to have retropulsed sensation" past, erect posture, mask face, severe vertical gaze palsy, he cannot look up or down on volitional gaze, no trouble with horizontal gaze, classic presentation of progressive supranuclear gaze palsy, frequent falling,  He has gone through physical therapy, which has helped some, he is now taking Sinemet 25/100 mg 3 times a day, which seems to help his symptoms mildly. He denies significant memory loss.  Extensive laboratory evaluation, anti Hu antibody, Ri antibody, Glutamic Acid Decarb ab were negative, normal thyroid functional test, no significant abnormality, mildly decreased vitamin D 27  CT chest showed  REVIEW OF SYSTEMS: Full 14 system review of systems performed and notable only for, walking difficulty   ALLERGIES: Allergies  Allergen Reactions  . Aspirin Shortness Of Breath    HOME MEDICATIONS: Current Outpatient Prescriptions  Medication Sig Dispense Refill  . Acetaminophen (TYLENOL PO) as needed.    . calcium carbonate (TUMS) 500 MG chewable tablet as needed for indigestion or  heartburn.    . carbidopa-levodopa (SINEMET IR) 25-100 MG tablet Take 1 tablet by mouth 3 (three) times daily. 90 tablet 11  . Chlorphen-Phenyleph-ASA (ALKA-SELTZER PLUS COLD PO) as needed.    . Cholecalciferol (VITAMIN D-3) 1000 units CAPS Take by mouth daily.    . clopidogrel (PLAVIX) 75 MG tablet Take 1 tablet (75 mg total) by mouth daily. 90 tablet 3  . lisinopril (PRINIVIL,ZESTRIL) 10 MG tablet TAKE 1 TABLET EVERY DAY FOR BLOOD PRESSURE  1  . Multiple Vitamins-Minerals (MULTIVITAMIN ADULT PO) daily.    . tamsulosin (FLOMAX) 0.4 MG CAPS capsule Take 0.4 mg by mouth daily.  3   No current facility-administered medications for this visit.     PAST MEDICAL HISTORY: Past Medical History:  Diagnosis Date  . Frequent falls   . Prostatitis     PAST SURGICAL HISTORY: Past Surgical History:  Procedure Laterality Date  . CYST REMOVAL NECK    . TONSILLECTOMY    . VASECTOMY      FAMILY HISTORY: Family History  Problem Relation Age of Onset  . Colon cancer Father   . COPD Father   . Hypertension Mother   . Hyperlipidemia Mother   . Heart disease Mother  SOCIAL HISTORY:  Social History   Social History  . Marital status: Single    Spouse name: N/A  . Number of children: 2  . Years of education: HS   Occupational History  . Retired    Social History Main Topics  . Smoking status: Current Every Day Smoker    Packs/day: 0.50    Types: Cigarettes  . Smokeless tobacco: Never Used  . Alcohol use No     Comment: No alcohol use in the last 9 months.  . Drug use: No  . Sexual activity: Not on file   Other Topics Concern  . Not on file   Social History Narrative   Lives with his sister.   Right-handed.   32 ounces of caffenated tea per day.     PHYSICAL EXAM   Vitals:   05/31/16 1510  BP: (!) 163/83  Pulse: 82  Weight: 166 lb (75.3 kg)  Height: '5\' 8"'  (1.727 m)    Body mass index is 25.24 kg/m.  PHYSICAL EXAMNIATION:  Gen: NAD, conversant, well  nourised, obese, well groomed                     Cardiovascular: Regular rate rhythm, no peripheral edema, warm, nontender. Eyes: Conjunctivae clear without exudates or hemorrhage Neck: Supple, no carotid bruise. Pulmonary: Clear to auscultation bilaterally   NEUROLOGICAL EXAM:  MENTAL STATUS: Speech:    Speech is normal; fluent and spontaneous with normal comprehension.  Cognition:     Orientation to time, place and person     Normal recent and remote memory     Normal Attention span and concentration     Normal Language, naming, repeating,spontaneous speech     Fund of knowledge   CRANIAL NERVES: CN II: Visual fields are full to confrontation. Fundoscopic exam is normal with sharp discs and no vascular changes. Pupils are round equal and briskly reactive to light. CN III, IV, VI: He has vertical gaze palsyin both upward and downward gait, fairly normal horizontal eye movement CN V: normal pin prick in all 3 divisions bilaterally. Corneal responses are intact.  CN VII: Face is symmetric with normal, he has moderate eye closure and bilateral cheek puff weakness. mild neck flexion weakness  CN VIII: Hearing is normal to rubbing fingers CN IX, X: Palate elevates symmetrically. Phonation is normal. CN XI: Head turning and shoulder shrug are intact CN XII: Tongue is midline with normal movements and no atrophy.  MOTOR: He has mild bilateral upper and lower extremity and nuchal rigidity, fairly symmetric, bilateral palmomental signs,  REFLEXES: Reflexes are 2+ and symmetric at the biceps, triceps, knees, and ankles. Plantar responses are flexor.  SENSORY: Intact to light touch, pinprick, position sense, and vibration sense are intact in fingers and toes.  COORDINATION: Rapid alternating movements and fine finger movements are intact. There is no dysmetria on finger-to-nose and heel-knee-shin.    GAIT/STANCE:  wide-based, steady, significant retropulsed instability, still able to  perform tandem walking, Romberg signs was negative   DIAGNOSTIC DATA (LABS, IMAGING, TESTING) - I reviewed patient records, labs, notes, testing and imaging myself where available.   ASSESSMENT AND PLAN  Honest Vanleer is a 71 y.o. male    Progressive supranuclear palsy  Continue physical therapy,  May give him a trial of higher dose Sinemet 25/100 mg 2 tablets 3 times a day   Vertebral artery stenosis  Keep daily aspirin    Rapid weight loss, long time smoker  CT chest  showed no significant abnormality,  Marcial Pacas, M.D. Ph.D.  [facility] [address], [place], [place] [address] Ph: [phone] Fax: [phone]  CC: Curlene Labrum, MD

## 2016-12-06 ENCOUNTER — Encounter: Payer: Self-pay | Admitting: Neurology

## 2016-12-06 ENCOUNTER — Ambulatory Visit (INDEPENDENT_AMBULATORY_CARE_PROVIDER_SITE_OTHER): Payer: Medicaid Other | Admitting: Neurology

## 2016-12-06 VITALS — BP 167/79 | HR 91 | Ht 68.0 in | Wt 160.8 lb

## 2016-12-06 DIAGNOSIS — G231 Progressive supranuclear ophthalmoplegia [Steele-Richardson-Olszewski]: Secondary | ICD-10-CM

## 2016-12-06 DIAGNOSIS — I6503 Occlusion and stenosis of bilateral vertebral arteries: Secondary | ICD-10-CM

## 2016-12-06 DIAGNOSIS — R269 Unspecified abnormalities of gait and mobility: Secondary | ICD-10-CM

## 2016-12-06 NOTE — Progress Notes (Signed)
Chief Complaint  Patient presents with  . Progressive Supranuclear Palsy    He is here with his sister Lorenza Chick, his mother [name] and his friend [name].  He has continued taking Sinemet, as prescribed.  He feels his walking is unchanged but he reports an increase in falls.   Chief Complaint  Patient presents with  . Progressive Supranuclear Palsy    He is here with his sister Lorenza Chick, his mother [name] and his friend [name].  He has continued taking Sinemet, as prescribed.  He feels his walking is unchanged but he reports an increase in falls.       PATIENT: Blake Stevens DOB: 02-27-1945  Chief Complaint  Patient presents with  . Progressive Supranuclear Palsy    He is here with his sister Lorenza Chick, his mother [name] and his friend [name].  He has continued taking Sinemet, as prescribed.  He feels his walking is unchanged but he reports an increase in falls.     HISTORICAL  Blake Stevens is a 71 years old right-handed male, accompanied by his sister [name], seen in refer by his primary care physician Dr. Florene Route for evaluation of frequent falling  Since 2016, he had recurrent episode of falling without any warning signs, gradually getting worse in 2016, in early January 2017, he had fell twice, he denied loss of consciousness, sudden drop to the floor, no bowel and bladder incontinence,   He denied persistent bilateral upper and lower extremity paresthesia or weakness, he denies gait difficulty in between, since summer of 2016, he began to have urinary urgency, occasionally bowel and bladder incontinence.   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   EEG was normal in Feb 2017. Echocardiogram in 2017 was reported normal, ejection fraction 55-60%,  Over the past 2 years, he was noted to have gradually worsening vertical eye movement, continue has frequent falling episode,  MRI of the brain with and without contrast in November 2016 from [facility], generalized atrophy, especially bilateral frontal, cerebellum, tectal region, mild supratentorium small vessel disease,   MRI of cervical spine in March 2017: Multilevel degenerative changes, Mild canal stenosis, most severe at C5-6, C6-7, no cord signal change, variable degree of foraminal stenosis, severe on right C3-4, C5-6, moderate left C6-7 foraminal stenosis,   MRI  lumbar spine mild degenerative disc disease no evidence of canal stenosis or significant foraminal stenosis, no significant abnormality on MRI of the thoracic spine   He was seen by [facility]  Dr. 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 50% proximal right ICA stenosis with calcification: No significant left ICA stenosis. High-grade bilateral vertebral artery origin stenosis, worse on the non dominant left side. Numerous subcutaneous masses, largest and predominately fatty in the subcutaneous back.   MRA of brain: The left vertebral artery is partially visualized and has a narrow, thready, irregular appearance near the vertebro-basilar junction. This may be due to  proximal stenosis vs proximal occlusion and retrograde filling via the right vertebral artery. The left greater than right distal branches of posterior cerebral arteries have mild irregularities may be due to mild atherosclerosis.  Extensive laboratory evaluations, normal or  negative CMP, CBC, TSH, CPK, Lyme titer,  myasthenia gravis panel ESR, C-reactive protein, RPR, HIV, B12, copper level, only abnormality is mild decrease vitamin D 27.  He was given a trial of Sinemet 25/100 mg 3 times a day since July 2017, he did not notice significant improvement, no significant side effect noticed, he lives with his sister, has become much less active, denies significant memory loss, but over the past few months, he has lost 30 pounds unintentionally, he used to be a heavy smoker, not only smoked 5 cigarettes each day,  UPDATE May 31 2016:  He was seen by Dr. Hoyt Koch on Apr 13 2016,  He was noted to have retropulsed instability, erect posture, mask face, severe vertical gaze palsy, he cannot look up or down on volitional gaze, no trouble with horizontal gaze, classic presentation of progressive supranuclear gaze palsy, frequent falling,  He has gone through physical therapy, which has helped some, he is now taking Sinemet 25/100 mg 3 times a day, which seems to help his symptoms mildly. He denies significant memory loss.  Extensive laboratory evaluation, anti Hu antibody, Ri antibody, Glutamic Acid Decarb ab were negative, normal thyroid functional test, no significant abnormality, mildly decreased vitamin D 27  CT chest showed no significant pulmonary disease.  UPDATE Dec 06 2016: He is accompanied by his family at today's clinical visit, had gradual worsening gait abnormality, more steady, tendency to fall, mild worsening slurred speech, no significant dysphagia,  REVIEW OF SYSTEMS: Full 14 system review of systems performed and notable only for as above cough   ALLERGIES: Allergies  Allergen Reactions  . Aspirin Shortness Of Breath    HOME MEDICATIONS: Current Outpatient Prescriptions  Medication Sig Dispense Refill  . Acetaminophen (TYLENOL PO) as needed.    . calcium carbonate (TUMS) 500 MG chewable tablet as needed for indigestion or heartburn.    .  carbidopa-levodopa (SINEMET IR) 25-100 MG tablet Take 2 tablets by mouth 3 (three) times daily. 180 tablet 11  . Chlorphen-Phenyleph-ASA (ALKA-SELTZER PLUS COLD PO) as needed.    . Cholecalciferol (VITAMIN D-3) 1000 units CAPS Take by mouth daily.    . clopidogrel (PLAVIX) 75 MG tablet Take 1 tablet (75 mg total) by mouth daily. 90 tablet 3  . lisinopril (PRINIVIL,ZESTRIL) 10 MG tablet TAKE 1 TABLET EVERY DAY FOR BLOOD PRESSURE  1  . Multiple Vitamins-Minerals (MULTIVITAMIN ADULT PO) daily.    . tamsulosin (FLOMAX) 0.4 MG CAPS capsule Take 0.4 mg by mouth daily.  3   No current facility-administered medications for this visit.     PAST MEDICAL HISTORY: Past Medical History:  Diagnosis Date  . Frequent falls   . Prostatitis     PAST SURGICAL HISTORY: Past Surgical History:  Procedure Laterality Date  . CYST REMOVAL NECK    . TONSILLECTOMY    . VASECTOMY      FAMILY HISTORY: Family History  Problem Relation Age of Onset  . Colon cancer Father   . COPD Father   . Hypertension Mother   . Hyperlipidemia Mother   . Heart disease Mother     SOCIAL HISTORY:  Social History   Social History  . Marital status: Single    Spouse name: N/A  . Number of children: 2  .  Years of education: HS   Occupational History  . Retired    Social History Main Topics  . Smoking status: Current Every Day Smoker    Packs/day: 0.50    Types: Cigarettes  . Smokeless tobacco: Never Used  . Alcohol use No     Comment: No alcohol use in the last 9 months.  . Drug use: No  . Sexual activity: Not on file   Other Topics Concern  . Not on file   Social History Narrative   Lives with his sister.   Right-handed.   32 ounces of caffenated tea per day.     PHYSICAL EXAM   Vitals:   12/06/16 1447  BP: (!) 167/79  Pulse: 91  Weight: 160 lb 12 oz (72.9 kg)  Height: _0  (1.727 m)    Body mass index is 24.44 kg/m.  PHYSICAL EXAMNIATION:  Gen: NAD, conversant, well nourised,  obese, well groomed                     Cardiovascular: Regular rate rhythm, no peripheral edema, warm, nontender. Eyes: Conjunctivae clear without exudates or hemorrhage Neck: Supple, no carotid bruise. Pulmonary: Clear to auscultation bilaterally   NEUROLOGICAL EXAM:  MENTAL STATUS: Speech:  Mild slurred wet speech ; fluent and spontaneous with normal comprehension.  Cognition:     Orientation to time, place and person     Normal recent and remote memory     Normal Attention span and concentration     Normal Language, naming, repeating,spontaneous speech     Fund of knowledge   CRANIAL NERVES: CN II: Visual fields are full to confrontation. Fundoscopic exam is normal with sharp discs and no vascular changes. Pupils are round equal and briskly reactive to light. CN III, IV, VI: He has vertical gaze palsyin both upward and downward gait, fairly normal horizontal eye movement CN V: normal pin prick in all 3 divisions bilaterally. Corneal responses are intact.  CN VII: Face is symmetri,he has moderate eye closure and bilateral cheek puff weakness. mild neck flexion weakness  CN VIII: Hearing is normal to rubbing fingers CN IX, X: Palate elevates symmetrically. Phonation is normal. CN XI: Head turning and shoulder shrug are intact CN XII: Tongue is midline with normal movements and no atrophy.  MOTOR: He has mild bilateral upper and lower extremity and nuchal rigidity, fairly symmetric, bilateral palmomental signs,  REFLEXES: Reflexes are 2+ and symmetric at the biceps, triceps, knees, and ankles. Plantar responses are flexor.  SENSORY: Intact to light touch, pinprick, position sense, and vibration sense are intact in fingers and toes.  COORDINATION: Rapid alternating movements and fine finger movements are intact. There is no dysmetria on finger-to-nose and heel-knee-shin.    GAIT/STANCE:  wide-based, steady, significant retropulsed instability. Romberg signs was negative    DIAGNOSTIC DATA (LABS, IMAGING, TESTING) - I reviewed patient records, labs, notes, testing and imaging myself where available.   ASSESSMENT AND PLAN  Momodou Consiglio is a 71 y.o. male    Progressive supranuclear palsy  Continue physical therapy,  keep Sinemet 25/100 mg 2 tablets 3 times a day  Refer him to physical therapy.  Vertebral artery stenosis   Keep daily aspirin    Rapid weight loss, long time smoker  CT chest showed no significant pulmonary abnormality,  Marcial Pacas, M.D. Ph.D.  [facility] [address], [place], [place] [address] Ph: [phone] Fax: [phone]  CC: Curlene Labrum, MD

## 2017-04-11 ENCOUNTER — Ambulatory Visit: Payer: Self-pay | Admitting: Urology

## 2017-05-24 ENCOUNTER — Ambulatory Visit (INDEPENDENT_AMBULATORY_CARE_PROVIDER_SITE_OTHER): Payer: Medicare Other | Admitting: Urology

## 2017-05-24 ENCOUNTER — Other Ambulatory Visit (HOSPITAL_COMMUNITY)
Admission: AD | Admit: 2017-05-24 | Discharge: 2017-05-24 | Disposition: A | Discharge status: Home / Self Care | Payer: Medicare Other | Source: Other Acute Inpatient Hospital | Attending: Urology | Admitting: Urology

## 2017-05-24 DIAGNOSIS — N401 Enlarged prostate with lower urinary tract symptoms: Secondary | ICD-10-CM

## 2017-05-24 DIAGNOSIS — N3941 Urge incontinence: Secondary | ICD-10-CM | POA: Insufficient documentation

## 2017-05-26 LAB — URINE CULTURE

## 2017-06-07 ENCOUNTER — Encounter: Payer: Self-pay | Admitting: Neurology

## 2017-06-07 ENCOUNTER — Ambulatory Visit: Payer: Medicaid Other | Admitting: Neurology

## 2017-06-07 VITALS — BP 152/81 | HR 86 | Ht 68.0 in | Wt 163.0 lb

## 2017-06-07 DIAGNOSIS — G231 Progressive supranuclear ophthalmoplegia [Steele-Richardson-Olszewski]: Secondary | ICD-10-CM | POA: Diagnosis not present

## 2017-06-07 DIAGNOSIS — I6503 Occlusion and stenosis of bilateral vertebral arteries: Secondary | ICD-10-CM

## 2017-06-07 MED ORDER — CARBIDOPA-LEVODOPA 25-100 MG PO TABS: 2.0000 | ORAL_TABLET | Freq: Three times a day (TID) | ORAL | 11 refills | Status: DC

## 2017-06-07 NOTE — Progress Notes (Signed)
Chief Complaint  Patient presents with  . Progressive Supranuclear Palsy    He is here with his mother, [name] and his friend, [name].  His walking is much worse.  Says he has a fall nearly everyday, despite using a cane.  He did not feel PT was very helpful.   Chief Complaint  Patient presents with  . Progressive Supranuclear Palsy    He is here with his mother, [name] and his friend, [name].  His walking is much worse.  Says he has a fall nearly everyday, despite using a cane.  He did not feel PT was very helpful.       PATIENT: Blake Stevens DOB: [birth year]  Chief Complaint  Patient presents with  . Progressive Supranuclear Palsy    He is here with his mother, [name] and his friend, [name].  His walking is much worse.  Says he has a fall nearly everyday, despite using a cane.  He did not feel PT was very helpful.     HISTORICAL  Blake Stevens is a 72 years old right-handed male, accompanied by his sister [name], seen in refer by his primary care physician Dr. Florene Route for evaluation of frequent falling  Since 2016, he had recurrent episode of falling without any warning signs, gradually getting worse in 2016, in early January 2017, he had fell twice, he denied loss of consciousness, sudden drop to the floor, no bowel and bladder incontinence,   He denied persistent bilateral upper and lower extremity paresthesia or weakness, he denies gait difficulty in between, since summer of 2016, he began to have urinary urgency, occasionally bowel and bladder incontinence.   He carries a diagnosis of seizure in the past, he had one incident that while visiting his family at the hospital around 2008, he suddenly dropped to the floor, he had no recollection of loss of consciousness, but the event was witnessed by health caregiver, he was diagnosed with seizure, was prescribed medications, but he never took the medicine, there was no recurrent episode until 2016, he reported that current  episode is very similar to previous episodes.   EEG was normal in Feb 2017. Echocardiogram in 2017 was reported normal, ejection fraction 55-60%,  Over the past 2 years, he was noted to have gradually worsening vertical eye movement, continue has frequent falling episode,  MRI of the brain with and without contrast in November 2016 from [facility], generalized atrophy, especially bilateral frontal, cerebellum, tectal region, mild supratentorium small vessel disease,   MRI of cervical spine in March 2017: Multilevel degenerative changes, Mild canal stenosis, most severe at C5-6, C6-7, no cord signal change, variable degree of foraminal stenosis, severe on right C3-4, C5-6, moderate left C6-7 foraminal stenosis,   MRI  lumbar spine mild degenerative disc disease no evidence of canal stenosis or significant foraminal stenosis, no significant abnormality on MRI of the thoracic spine   He was seen by [facility]  Dr. 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 50% proximal right ICA stenosis with calcification: No significant left ICA stenosis. High-grade bilateral vertebral artery origin stenosis, worse on the non dominant left side. Numerous subcutaneous masses, largest and predominately fatty in the subcutaneous back.   MRA of brain: The left vertebral artery is partially visualized and has a narrow, thready, irregular appearance near the vertebro-basilar  junction. This may be due to proximal stenosis vs proximal occlusion and retrograde filling via the right vertebral artery. The left greater than right distal branches of posterior cerebral arteries have mild irregularities may be due to mild atherosclerosis.  Extensive laboratory evaluations, normal or negative CMP, CBC, TSH, CPK, Lyme  titer,  myasthenia gravis panel ESR, C-reactive protein, RPR, HIV, B12, copper level, only abnormality is mild decrease vitamin D 27.  He was given a trial of Sinemet 25/100 mg 3 times a day since July 2017, he did not notice significant improvement, no significant side effect noticed, he lives with his sister, has become much less active, denies significant memory loss, but over the past few months, he has lost 30 pounds unintentionally, he used to be a heavy smoker, not only smoked 5 cigarettes each day,  UPDATE May 31 2016:  He was seen by Dr. Hoyt Koch on Apr 13 2016,  He was noted to have retropulsed instability, erect posture, mask face, severe vertical gaze palsy, he cannot look up or down on volitional gaze, no trouble with horizontal gaze, classic presentation of progressive supranuclear gaze palsy, frequent falling,  He has gone through physical therapy, which has helped some, he is now taking Sinemet 25/100 mg 3 times a day, which seems to help his symptoms mildly. He denies significant memory loss.  Extensive laboratory evaluation, anti Hu antibody, Ri antibody, Glutamic Acid Decarb ab were negative, normal thyroid functional test, no significant abnormality, mildly decreased vitamin D 27  CT chest showed no significant pulmonary disease.  UPDATE Dec 06 2016: He is accompanied by his family at today's clinical visit, had gradual worsening gait abnormality, more steady, tendency to fall, mild worsening slurred speech, no significant dysphagia,  UPDATE June 07 2017: He is accompanied by his mother, and friend at today's visit, continues slow decline, worsening gait abnormality, follow-up frequently, also mild slurred speech, still able to eat regular food, but noted increased cough at nighttime,  REVIEW OF SYSTEMS: Full 14 system review of systems performed and notable only for as above cough   ALLERGIES: Allergies  Allergen Reactions  . Aspirin Shortness Of Breath    HOME  MEDICATIONS: Current Outpatient Medications  Medication Sig Dispense Refill  . Acetaminophen (TYLENOL PO) as needed.    . calcium carbonate (TUMS) 500 MG chewable tablet as needed for indigestion or heartburn.    . carbidopa-levodopa (SINEMET IR) 25-100 MG tablet Take 2 tablets by mouth 3 (three) times daily. 180 tablet 11  . Chlorphen-Phenyleph-ASA (ALKA-SELTZER PLUS COLD PO) as needed.    . Cholecalciferol (VITAMIN D-3) 1000 units CAPS Take by mouth daily.    . clopidogrel (PLAVIX) 75 MG tablet Take 1 tablet (75 mg total) by mouth daily. 90 tablet 3  . lisinopril (PRINIVIL,ZESTRIL) 10 MG tablet TAKE 1 TABLET EVERY DAY FOR BLOOD PRESSURE  1  . Multiple Vitamins-Minerals (MULTIVITAMIN ADULT PO) daily.    . tamsulosin (FLOMAX) 0.4 MG CAPS capsule Take 0.4 mg by mouth daily.  3   No current facility-administered medications for this visit.     PAST MEDICAL HISTORY: Past Medical History:  Diagnosis Date  . Frequent falls   . Prostatitis     PAST SURGICAL HISTORY: Past Surgical History:  Procedure Laterality Date  . CYST REMOVAL NECK    . TONSILLECTOMY    . VASECTOMY      FAMILY HISTORY: Family History  Problem Relation Age of Onset  . Colon cancer Father   . COPD Father   .  Hypertension Mother   . Hyperlipidemia Mother   . Heart disease Mother     SOCIAL HISTORY:  Social History   Socioeconomic History  . Marital status: Single    Spouse name: Not on file  . Number of children: 2  . Years of education: HS  . Highest education level: Not on file  Occupational History  . Occupation: Retired  Scientific laboratory technician  . Financial resource strain: Not on file  . Food insecurity:    Worry: Not on file    Inability: Not on file  . Transportation needs:    Medical: Not on file    Non-medical: Not on file  Tobacco Use  . Smoking status: Current Every Day Smoker    Packs/day: 0.50    Types: Cigarettes  . Smokeless tobacco: Never Used  Substance and Sexual Activity  .  Alcohol use: No    Alcohol/week: 0.0 oz    Comment: No alcohol use in the last 9 months.  . Drug use: No  . Sexual activity: Not on file  Lifestyle  . Physical activity:    Days per week: Not on file    Minutes per session: Not on file  . Stress: Not on file  Relationships  . Social connections:    Talks on phone: Not on file    Gets together: Not on file    Attends religious service: Not on file    Active member of club or organization: Not on file    Attends meetings of clubs or organizations: Not on file    Relationship status: Not on file  . Intimate partner violence:    Fear of current or ex partner: Not on file    Emotionally abused: Not on file    Physically abused: Not on file    Forced sexual activity: Not on file  Other Topics Concern  . Not on file  Social History Narrative   Lives with his sister.   Right-handed.   32 ounces of caffenated tea per day.     PHYSICAL EXAM   Vitals:   06/07/17 1118  BP: (!) 152/81  Pulse: 86  Weight: 163 lb (73.9 kg)  Height: '5\' 8"'  (1.727 m)    Body mass index is 24.78 kg/m.  PHYSICAL EXAMNIATION:  Gen: NAD, conversant, well nourised, obese, well groomed                     Cardiovascular: Regular rate rhythm, no peripheral edema, warm, nontender. Eyes: Conjunctivae clear without exudates or hemorrhage Neck: Supple, no carotid bruise. Pulmonary: Clear to auscultation bilaterally   NEUROLOGICAL EXAM:  MENTAL STATUS: Speech:  Mild slurred wet speech ; fluent and spontaneous with normal comprehension.  Cognition:     Orientation to time, place and person     Normal recent and remote memory     Normal Attention span and concentration     Normal Language, naming, repeating,spontaneous speech     Fund of knowledge   CRANIAL NERVES: CN II: Visual fields are full to confrontation. Fundoscopic exam is normal with sharp discs and no vascular changes. Pupils are round equal and briskly reactive to light. CN III, IV, VI:  He has vertical gaze palsyin both upward and downward gait, limited range of horizontal eye movement CN V: normal pin prick in all 3 divisions bilaterally. Corneal responses are intact.  CN VII: Face is symmetri,he has moderate eye closure and bilateral cheek puff weakness. mild neck flexion weakness  CN VIII: Hearing is normal to rubbing fingers CN IX, X: Palate elevates symmetrically. Phonation is normal. CN XI: Head turning and shoulder shrug are intact CN XII: Tongue is midline with normal movements and no atrophy.  MOTOR: He has mild bilateral upper and lower extremity and nuchal rigidity, fairly symmetric, bilateral palmomental signs,  REFLEXES: Reflexes are 2+ and symmetric at the biceps, triceps, knees, and ankles. Plantar responses are flexor.  SENSORY: Intact to light touch, pinprick, position sense, and vibration sense are intact in fingers and toes.  COORDINATION: Rapid alternating movements and fine finger movements are intact. There is no dysmetria on finger-to-nose and heel-knee-shin.    GAIT/STANCE:  wide-based, steady, significant retropulsed instability. Romberg signs was negative   DIAGNOSTIC DATA (LABS, IMAGING, TESTING) - I reviewed patient records, labs, notes, testing and imaging myself where available.   ASSESSMENT AND PLAN  Blake Stevens is a 72 y.o. male    Progressive supranuclear palsy  Slow worsening gait abnormality, increased to 4  Physical therapy,  keep Sinemet 25/100 mg 2 tablets 3 times a day  Slow worsening of his swallowing difficulty, slurred speech, will refer him to barium swallowing study  Vertebral artery stenosis   Keep daily aspirin    Marcial Pacas, M.D. Ph.D.  [facility] [address] [place], [place] [address] Ph: [phone] Fax: [phone]  CC: Curlene Labrum, MD

## 2017-06-12 ENCOUNTER — Telehealth: Payer: Self-pay | Admitting: Neurology

## 2017-06-12 NOTE — Telephone Encounter (Signed)
Blake Stevens/[facility] called to advise the pt has Medicare part D which is hospital benefit only. She said the pt also has medicaid benefits but they are not contracted with medicaid. FYI

## 2017-06-14 NOTE — Telephone Encounter (Signed)
Patient has sent to [facility]. Patient needed to go to [facility] facility do to insurance. Patient is aware of details.

## 2017-07-05 ENCOUNTER — Ambulatory Visit (INDEPENDENT_AMBULATORY_CARE_PROVIDER_SITE_OTHER): Payer: Medicare Other | Admitting: Urology

## 2017-07-05 DIAGNOSIS — N401 Enlarged prostate with lower urinary tract symptoms: Secondary | ICD-10-CM

## 2017-07-05 DIAGNOSIS — N3941 Urge incontinence: Secondary | ICD-10-CM | POA: Diagnosis not present

## 2017-10-18 ENCOUNTER — Ambulatory Visit (INDEPENDENT_AMBULATORY_CARE_PROVIDER_SITE_OTHER): Payer: Medicare Other | Admitting: Urology

## 2017-10-18 DIAGNOSIS — N401 Enlarged prostate with lower urinary tract symptoms: Secondary | ICD-10-CM

## 2017-10-18 DIAGNOSIS — N3941 Urge incontinence: Secondary | ICD-10-CM | POA: Diagnosis not present

## 2018-05-10 ENCOUNTER — Other Ambulatory Visit: Payer: Self-pay | Admitting: Neurology

## 2018-06-06 ENCOUNTER — Telehealth: Payer: Self-pay

## 2018-06-06 NOTE — Telephone Encounter (Signed)
I called patient to discuss his upcoming appt and that Due to current COVID 19 pandemic, our office is severely reducing in office visits, in order to minimize the risk to our patients and healthcare providers.   Patient did not answer and there wasn't a voicemail to leave a message.

## 2018-06-11 NOTE — Telephone Encounter (Signed)
I tried calling the number on file again, this is the third attempt to reach patient without success. I will remove patient from schedule, but if he is to call back, please offer virtual visit and schedule on a different date/time.

## 2018-06-12 ENCOUNTER — Ambulatory Visit: Payer: Medicaid Other | Admitting: Neurology

## 2019-11-22 DEATH — deceased
